# Patient Record
Sex: Female | Born: 1975
Health system: Southern US, Community
[De-identification: ages and names within clinical notes are randomized; demographics above are authoritative.]

## PROBLEM LIST (undated history)

## (undated) DIAGNOSIS — E559 Vitamin D deficiency, unspecified: Secondary | ICD-10-CM

## (undated) DIAGNOSIS — R739 Hyperglycemia, unspecified: Secondary | ICD-10-CM

## (undated) DIAGNOSIS — E785 Hyperlipidemia, unspecified: Secondary | ICD-10-CM

## (undated) DIAGNOSIS — D259 Leiomyoma of uterus, unspecified: Secondary | ICD-10-CM

## (undated) DIAGNOSIS — R87629 Unspecified abnormal cytological findings in specimens from vagina: Secondary | ICD-10-CM

## (undated) DIAGNOSIS — D649 Anemia, unspecified: Secondary | ICD-10-CM

## (undated) HISTORY — DX: Unspecified abnormal cytological findings in specimens from vagina: R87.629

## (undated) HISTORY — DX: Hyperglycemia, unspecified: R73.9

## (undated) HISTORY — DX: Hyperlipidemia, unspecified: E78.5

## (undated) HISTORY — DX: Anemia, unspecified: D64.9

## (undated) HISTORY — DX: Leiomyoma of uterus, unspecified: D25.9

## (undated) HISTORY — PX: NO PAST SURGERIES: SHX2092

## (undated) HISTORY — DX: Vitamin D deficiency, unspecified: E55.9

---

## 2017-02-27 DIAGNOSIS — E559 Vitamin D deficiency, unspecified: Secondary | ICD-10-CM | POA: Insufficient documentation

## 2017-02-27 DIAGNOSIS — R102 Pelvic and perineal pain: Secondary | ICD-10-CM | POA: Insufficient documentation

## 2018-03-25 DIAGNOSIS — Z113 Encounter for screening for infections with a predominantly sexual mode of transmission: Secondary | ICD-10-CM | POA: Diagnosis not present

## 2018-03-25 DIAGNOSIS — Z23 Encounter for immunization: Secondary | ICD-10-CM | POA: Diagnosis not present

## 2018-03-25 DIAGNOSIS — R102 Pelvic and perineal pain: Secondary | ICD-10-CM | POA: Diagnosis not present

## 2018-03-25 DIAGNOSIS — Z0000 Encounter for general adult medical examination without abnormal findings: Secondary | ICD-10-CM | POA: Diagnosis not present

## 2018-03-26 ENCOUNTER — Other Ambulatory Visit (HOSPITAL_BASED_OUTPATIENT_CLINIC_OR_DEPARTMENT_OTHER): Payer: Self-pay | Admitting: Family Medicine

## 2018-03-26 DIAGNOSIS — R102 Pelvic and perineal pain: Secondary | ICD-10-CM

## 2018-03-26 DIAGNOSIS — Z0000 Encounter for general adult medical examination without abnormal findings: Secondary | ICD-10-CM

## 2018-04-26 ENCOUNTER — Ambulatory Visit (HOSPITAL_BASED_OUTPATIENT_CLINIC_OR_DEPARTMENT_OTHER): Payer: Self-pay

## 2018-04-26 ENCOUNTER — Other Ambulatory Visit (HOSPITAL_BASED_OUTPATIENT_CLINIC_OR_DEPARTMENT_OTHER): Payer: Self-pay

## 2018-05-03 ENCOUNTER — Ambulatory Visit (HOSPITAL_BASED_OUTPATIENT_CLINIC_OR_DEPARTMENT_OTHER)
Admission: RE | Admit: 2018-05-03 | Discharge: 2018-05-03 | Disposition: A | Discharge status: Home / Self Care | Payer: 59 | Source: Ambulatory Visit | Attending: Family Medicine | Admitting: Family Medicine

## 2018-05-03 ENCOUNTER — Encounter (HOSPITAL_BASED_OUTPATIENT_CLINIC_OR_DEPARTMENT_OTHER): Payer: Self-pay | Admitting: Radiology

## 2018-05-03 DIAGNOSIS — Z1231 Encounter for screening mammogram for malignant neoplasm of breast: Secondary | ICD-10-CM | POA: Diagnosis not present

## 2018-05-03 DIAGNOSIS — Z0000 Encounter for general adult medical examination without abnormal findings: Secondary | ICD-10-CM

## 2018-05-03 DIAGNOSIS — R102 Pelvic and perineal pain: Secondary | ICD-10-CM | POA: Insufficient documentation

## 2018-05-18 DIAGNOSIS — N898 Other specified noninflammatory disorders of vagina: Secondary | ICD-10-CM | POA: Diagnosis not present

## 2018-05-18 DIAGNOSIS — S76012A Strain of muscle, fascia and tendon of left hip, initial encounter: Secondary | ICD-10-CM | POA: Diagnosis not present

## 2018-05-18 DIAGNOSIS — B373 Candidiasis of vulva and vagina: Secondary | ICD-10-CM | POA: Diagnosis not present

## 2018-10-01 IMAGING — US US PELVIS COMPLETE TRANSABD/TRANSVAG
1 series · 14 of 25 positions shown · non-contrast
Comparison: None

CLINICAL DATA: Generalized pelvic pain.

EXAM:
TRANSABDOMINAL AND TRANSVAGINAL ULTRASOUND OF PELVIS
TECHNIQUE: Both transabdominal and transvaginal ultrasound examinations of the
pelvis were performed. Transabdominal technique was performed for
global imaging of the pelvis including uterus, ovaries, adnexal
regions, and pelvic cul-de-sac. It was necessary to proceed with
endovaginal exam following the transabdominal exam to visualize the
endometrium and adnexal structures.

[Series 1: us pelvis complete transabd/transvag · 0.24mm/px · 14 of 45 slices shown]
[im 1/45]
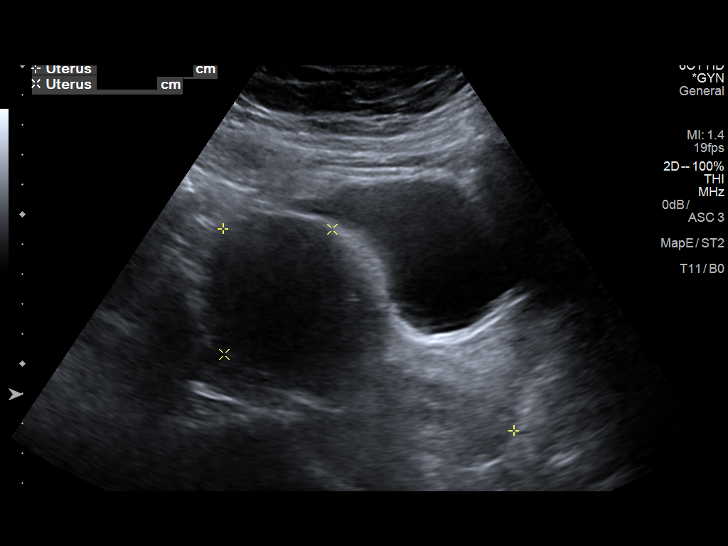
[im 4/45]
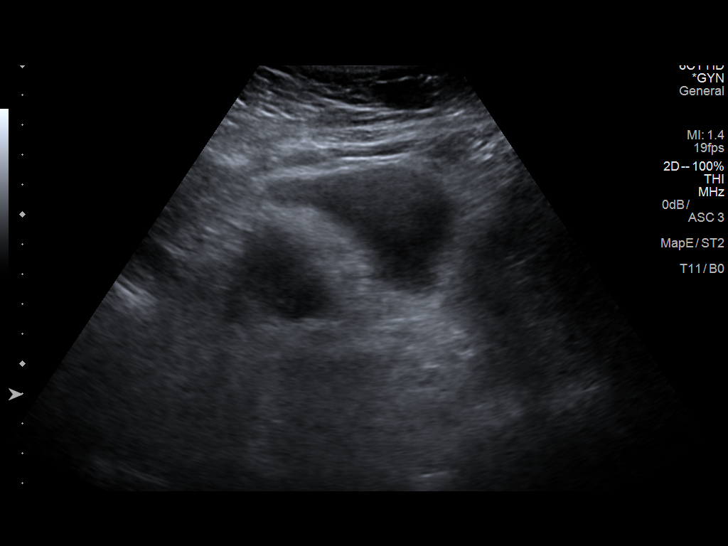
[im 8/45]
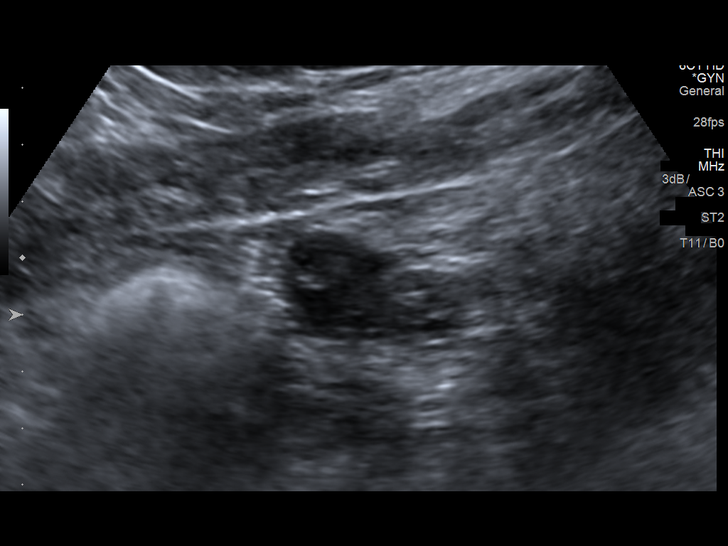
[im 12/45]
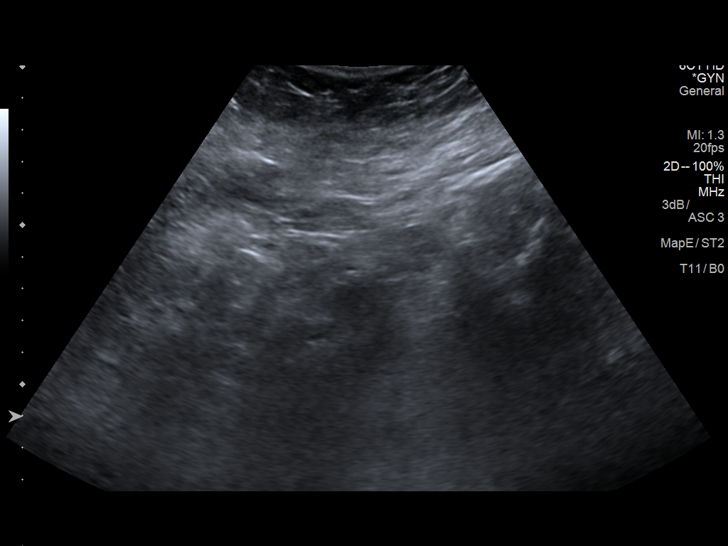
[im 15/45]
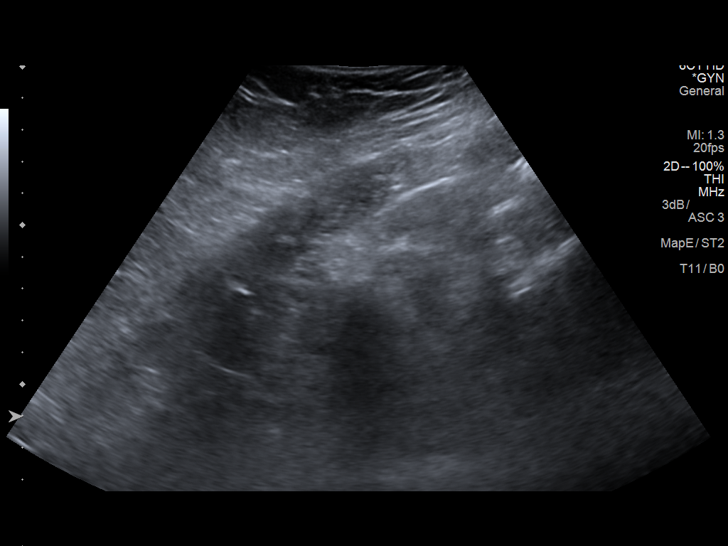
[im 17/45]
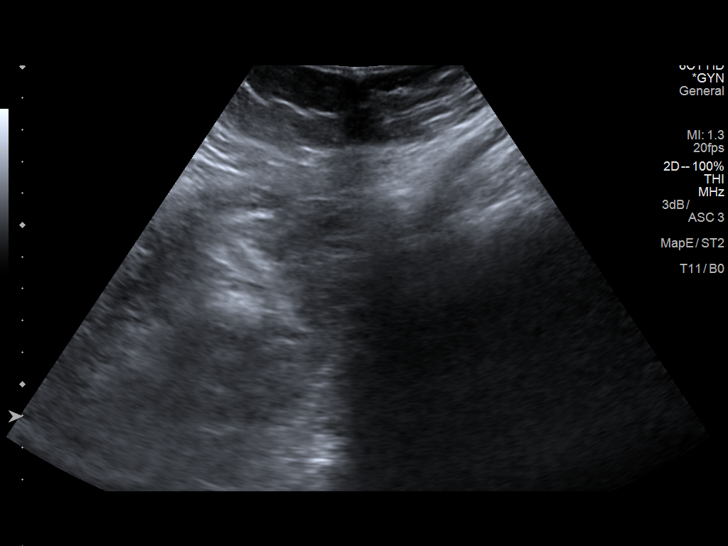
[im 21/45]
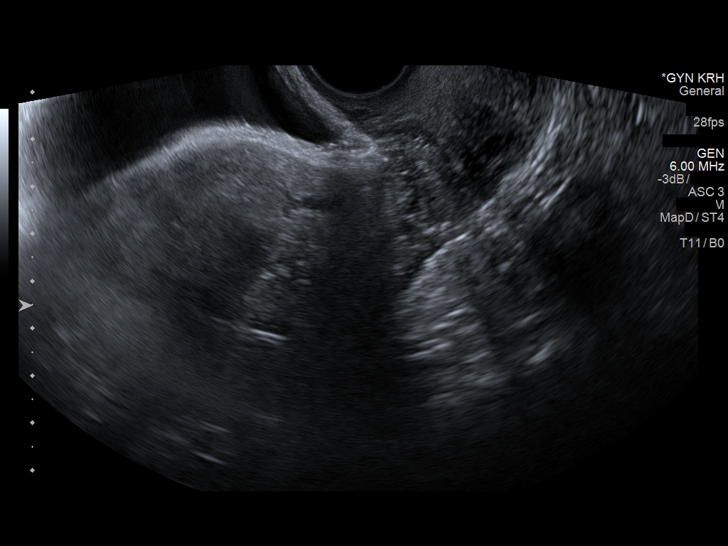
[im 24/45]
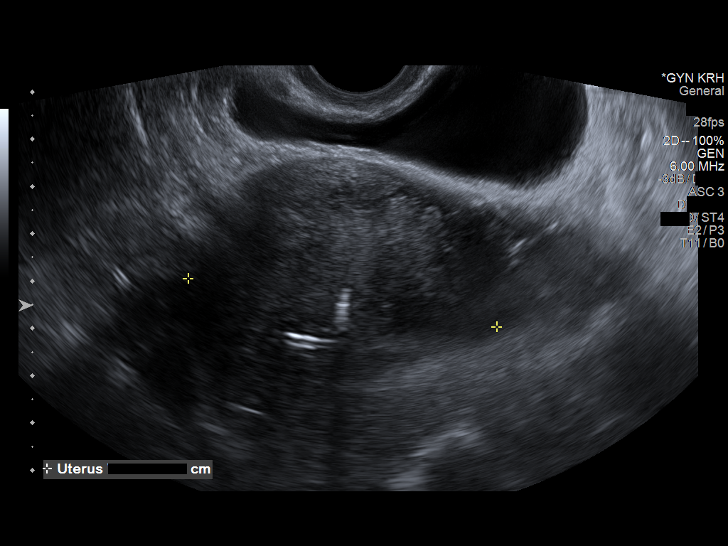
[im 28/45]
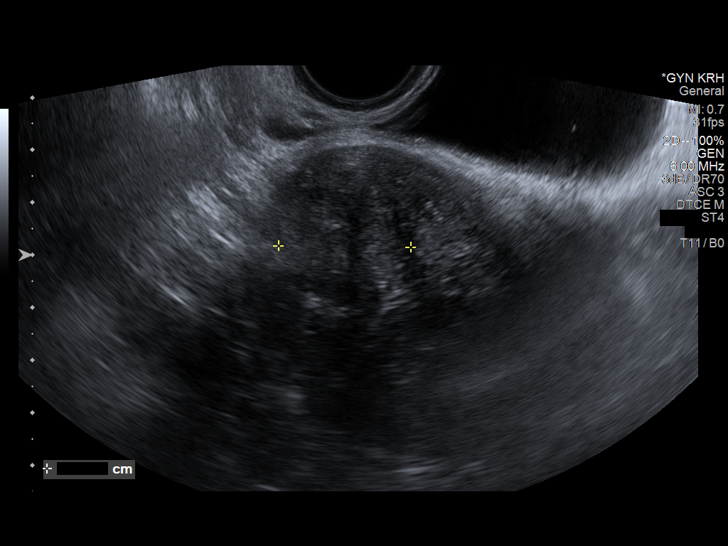
[im 30/45]
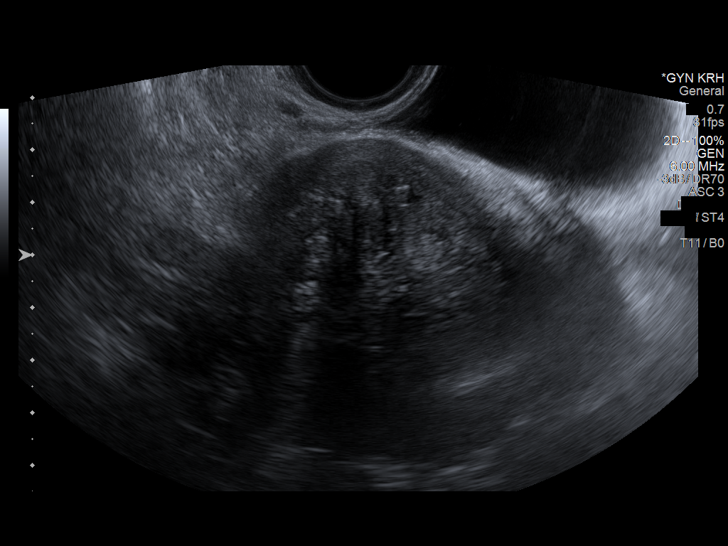
[im 34/45]
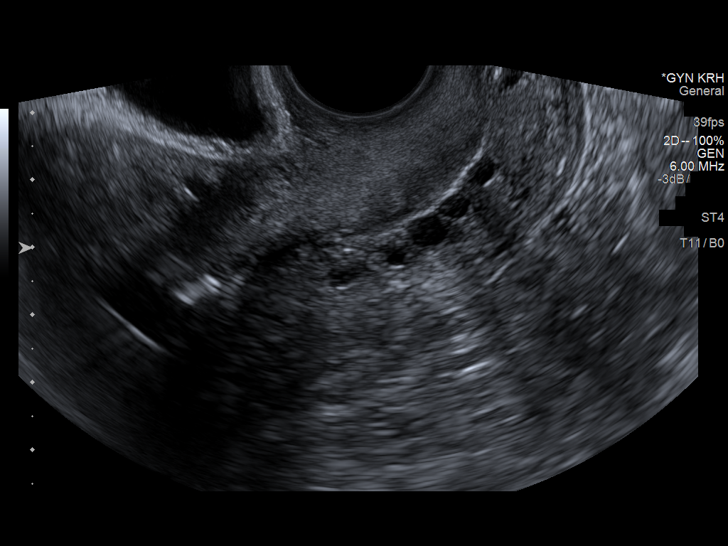
[im 37/45]
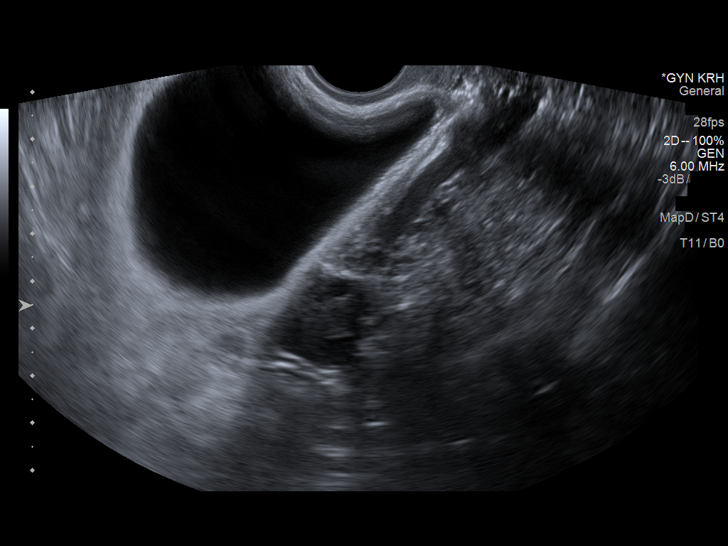
[im 41/45]
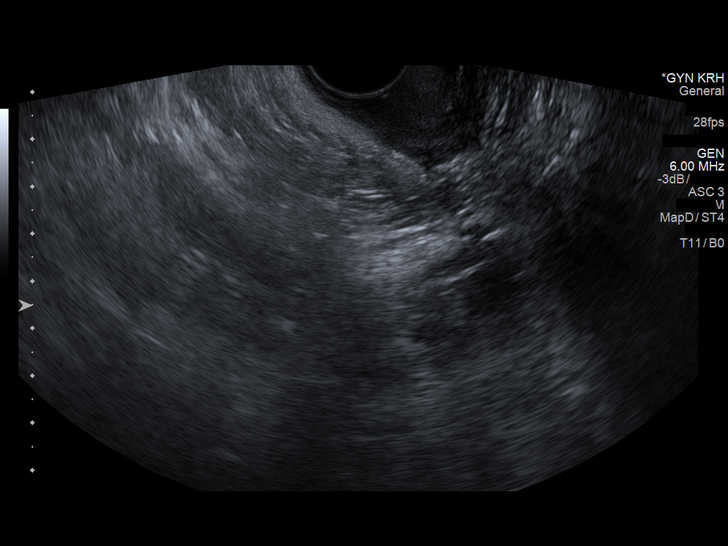
[im 45/45]
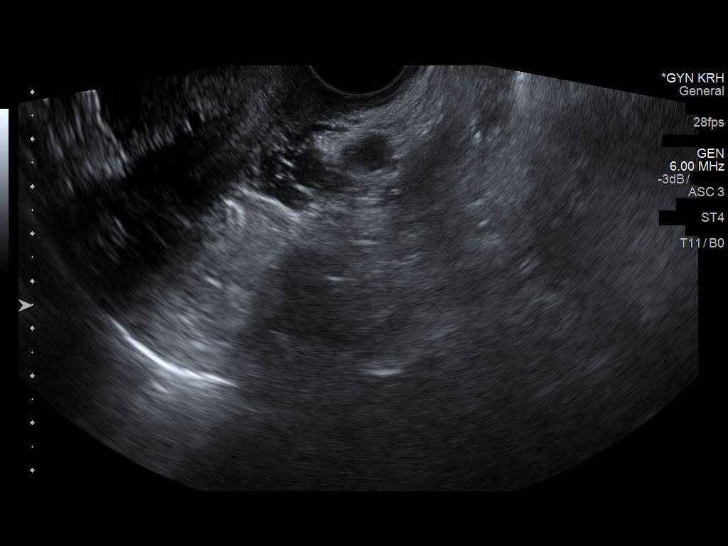

[14 of 25 positions shown; findings below may reference images not displayed]

FINDINGS: Uterus

Measurements: 10.9 x 6.0 x 6.6 cm. Within the right aspect of the
uterine body there is a 2.9 x 2.6 x 2.5 cm intramural fibroid.

Endometrium

Thickness: 6 mm. Intrauterine device appears appropriately
positioned.

Right ovary

Not visualized

Left ovary

Measurements: 2.4 x 1.9 x 2.1 cm. Normal appearance/no adnexal mass.

Other findings

No abnormal free fluid.
IMPRESSION: IUD appears appropriately positioned.

## 2019-07-09 ENCOUNTER — Inpatient Hospital Stay (HOSPITAL_BASED_OUTPATIENT_CLINIC_OR_DEPARTMENT_OTHER): Admission: RE | Admit: 2019-07-09 | Payer: 59 | Source: Ambulatory Visit

## 2019-07-09 ENCOUNTER — Other Ambulatory Visit: Payer: Self-pay

## 2019-07-09 ENCOUNTER — Ambulatory Visit (INDEPENDENT_AMBULATORY_CARE_PROVIDER_SITE_OTHER): Payer: 59 | Admitting: Obstetrics & Gynecology

## 2019-07-09 ENCOUNTER — Encounter: Payer: Self-pay | Admitting: Obstetrics & Gynecology

## 2019-07-09 VITALS — BP 110/63 | HR 59 | Ht 64.0 in | Wt 245.0 lb

## 2019-07-09 DIAGNOSIS — Z01419 Encounter for gynecological examination (general) (routine) without abnormal findings: Secondary | ICD-10-CM

## 2019-07-09 DIAGNOSIS — Z30432 Encounter for removal of intrauterine contraceptive device: Secondary | ICD-10-CM | POA: Diagnosis not present

## 2019-07-09 DIAGNOSIS — D219 Benign neoplasm of connective and other soft tissue, unspecified: Secondary | ICD-10-CM

## 2019-07-09 DIAGNOSIS — Z30430 Encounter for insertion of intrauterine contraceptive device: Secondary | ICD-10-CM

## 2019-07-09 MED ORDER — PARAGARD INTRAUTERINE COPPER IU IUD
INTRAUTERINE_SYSTEM | Freq: Once | INTRAUTERINE | Status: AC
  Administered 2019-07-09: 10:00:00 1 via INTRAUTERINE

## 2019-07-09 NOTE — Addendum Note (Signed)
Addended by: Phill Myron on: 07/09/2019 10:58 AM   Modules accepted: Orders

## 2019-07-09 NOTE — Progress Notes (Signed)
Subjective:     Emma Espinoza is a 43 y.o. female here for a routine exam.G2P2 Pt has Paragard IUD. She is interested in getting her current IUD removed and having it replaced. She reports a h/o fibrods that are small. She has monthly cycles that are heavy on days 1-2. She uses 3-4 pads on those days. She does not consider this a problem.   Gynecologic History No LMP recorded. Contraception: IUD Last Pap: 03/2018. Results were: normal Last mammogram: 05/03/2018. Results were: normal  Obstetric History OB History  Gravida Para Term Preterm AB Living  2 2 2     2   SAB TAB Ectopic Multiple Live Births          2    # Outcome Date GA Lbr Len/2nd Weight Sex Delivery Anes PTL Lv  2 Term      Vag-Spont     1 Term      Vag-Spont       The following portions of the patient's history were reviewed and updated as appropriate: allergies, current medications, past family history, past medical history, past social history, past surgical history and problem list.  Review of Systems Pertinent items are noted in HPI.    Objective:  BP 110/63   Pulse (!) 59   Ht 5\' 4"  (1.626 m)   Wt 245 lb (111.1 kg)   BMI 42.05 kg/m      General Appearance:    Alert, cooperative, no distress, appears stated age  Head:    Normocephalic, without obvious abnormality, atraumatic  Eyes:    conjunctiva/corneas clear, EOM's intact, both eyes  Ears:    Normal external ear canals, both ears  Nose:   Nares normal, septum midline, mucosa normal, no drainage    or sinus tenderness  Throat:   Lips, mucosa, and tongue normal; teeth and gums normal  Neck:   Supple, symmetrical, trachea midline, no adenopathy;    thyroid:  no enlargement/tenderness/nodules  Back:     Symmetric, no curvature, ROM normal, no CVA tenderness  Lungs:     respirations unlabored  Chest Wall:    No tenderness or deformity   Heart:    Regular rate and rhythm  Breast Exam:    No tenderness, masses, or nipple abnormality  Abdomen:     Soft,  non-tender, bowel sounds active all four quadrants,    no masses, no organomegaly  Genitalia:    Normal female without lesion, discharge or tenderness; uterus- small; mobile.       Extremities:   Extremities normal, atraumatic, no cyanosis or edema  Pulses:   2+ and symmetric all extremities  Skin:   Skin color, texture, turgor normal, no rashes or lesions   GYNECOLOGY CLINIC PROCEDURE NOTE Patient identified, informed consent performed.  Discussed risks of irregular bleeding, cramping, infection, malpositioning or misplacement of the IUD outside the uterus which may require further procedures. Time out was performed.  Urine pregnancy test negative.  Patient was in the dorsal lithotomy position, normal external genitalia was noted.  A speculum was placed in the patient's vagina, normal discharge was noted, no lesions. The multiparous cervix was visualized, no lesions, no abnormal discharge;  and the cervix was swabbed with Betadine using scopettes. The strings of the IUD were grasped and pulled using ring forceps.  The IUD was successfully removed in its entirety. Cervix visualized.  Cleaned with Betadine x 2.  Grasped anteriorly with a single tooth tenaculum.  Uterus sounded to 8cm.  Paragard  IUD placed per manufacturer's recommendations.  Strings trimmed to 3 cm. Tenaculum was removed, good hemostasis noted.  Patient tolerated procedure well.     Assessment:    Healthy female exam.   Contraception counseling- IUD removal and insertion. Reviewed option of LnIUD. Pt is happy with her Paragard.  Fibroids- asymptomatic.       Plan:   Patient was given post-procedure instructions.  Patient was asked to follow up in 4 weeks for IUD check. F/u in 1 year for annual  Mammogram ordered  Shonta Phillis L. Harraway-Smith, M.D., Cherlynn June

## 2019-07-09 NOTE — Patient Instructions (Signed)
Intrauterine Device Insertion, Care After  This sheet gives you information about how to care for yourself after your procedure. Your health care provider may also give you more specific instructions. If you have problems or questions, contact your health care provider. What can I expect after the procedure? After the procedure, it is common to have:  Cramps and pain in the abdomen.  Light bleeding (spotting) or heavier bleeding that is like your menstrual period. This may last for up to a few days.  Lower back pain.  Dizziness.  Headaches.  Nausea. Follow these instructions at home:  Before resuming sexual activity, check to make sure that you can feel the IUD string(s). You should be able to feel the end of the string(s) below the opening of your cervix. If your IUD string is in place, you may resume sexual activity. ? If you had a hormonal IUD inserted more than 7 days after your most recent period started, you will need to use a backup method of birth control for 7 days after IUD insertion. Ask your health care provider whether this applies to you.  Continue to check that the IUD is still in place by feeling for the string(s) after every menstrual period, or once a month.  Take over-the-counter and prescription medicines only as told by your health care provider.  Do not drive or use heavy machinery while taking prescription pain medicine.  Keep all follow-up visits as told by your health care provider. This is important. Contact a health care provider if:  You have bleeding that is heavier or lasts longer than a normal menstrual cycle.  You have a fever.  You have cramps or abdominal pain that get worse or do not get better with medicine.  You develop abdominal pain that is new or is not in the same area of earlier cramping and pain.  You feel lightheaded or weak.  You have abnormal or bad-smelling discharge from your vagina.  You have pain during sexual activity.   You have any of the following problems with your IUD string(s): ? The string bothers or hurts you or your sexual partner. ? You cannot feel the string. ? The string has gotten longer.  You can feel the IUD in your vagina.  You think you may be pregnant, or you miss your menstrual period.  You think you may have an STI (sexually transmitted infection). Get help right away if:  You have flu-like symptoms.  You have a fever and chills.  You can feel that your IUD has slipped out of place. Summary  After the procedure, it is common to have cramps and pain in the abdomen. It is also common to have light bleeding (spotting) or heavier bleeding that is like your menstrual period.  Continue to check that the IUD is still in place by feeling for the string(s) after every menstrual period, or once a month.  Keep all follow-up visits as told by your health care provider. This is important.  Contact your health care provider if you have problems with your IUD string(s), such as the string getting longer or bothering you or your sexual partner. This information is not intended to replace advice given to you by your health care provider. Make sure you discuss any questions you have with your health care provider. Document Released: 04/04/2011 Document Revised: 07/19/2017 Document Reviewed: 06/27/2016 Elsevier Patient Education  2020 [employer]  

## 2019-07-09 NOTE — Progress Notes (Signed)
Patient reports having paraguard for ten years. Patient needs this replaced and would like another paraguard. Kathrene Alu RN

## 2019-07-21 ENCOUNTER — Other Ambulatory Visit: Payer: Self-pay

## 2019-07-21 ENCOUNTER — Encounter (HOSPITAL_BASED_OUTPATIENT_CLINIC_OR_DEPARTMENT_OTHER): Payer: Self-pay

## 2019-07-21 ENCOUNTER — Ambulatory Visit (HOSPITAL_BASED_OUTPATIENT_CLINIC_OR_DEPARTMENT_OTHER)
Admission: RE | Admit: 2019-07-21 | Discharge: 2019-07-21 | Disposition: A | Discharge status: Home / Self Care | Payer: 59 | Source: Ambulatory Visit | Attending: Obstetrics & Gynecology | Admitting: Obstetrics & Gynecology

## 2019-07-21 DIAGNOSIS — Z1231 Encounter for screening mammogram for malignant neoplasm of breast: Secondary | ICD-10-CM | POA: Insufficient documentation

## 2019-07-21 DIAGNOSIS — Z01419 Encounter for gynecological examination (general) (routine) without abnormal findings: Secondary | ICD-10-CM

## 2019-08-12 ENCOUNTER — Ambulatory Visit: Payer: 59 | Admitting: Obstetrics & Gynecology

## 2020-07-13 ENCOUNTER — Ambulatory Visit (INDEPENDENT_AMBULATORY_CARE_PROVIDER_SITE_OTHER): Payer: 59 | Admitting: Family Medicine

## 2020-07-13 ENCOUNTER — Encounter: Payer: Self-pay | Admitting: Family Medicine

## 2020-07-13 ENCOUNTER — Other Ambulatory Visit (HOSPITAL_COMMUNITY)
Admission: RE | Admit: 2020-07-13 | Discharge: 2020-07-13 | Disposition: A | Discharge status: Home / Self Care | Payer: 59 | Source: Ambulatory Visit | Attending: Family Medicine | Admitting: Family Medicine

## 2020-07-13 ENCOUNTER — Other Ambulatory Visit: Payer: Self-pay

## 2020-07-13 DIAGNOSIS — Z01419 Encounter for gynecological examination (general) (routine) without abnormal findings: Secondary | ICD-10-CM | POA: Diagnosis not present

## 2020-07-13 DIAGNOSIS — Z124 Encounter for screening for malignant neoplasm of cervix: Secondary | ICD-10-CM | POA: Insufficient documentation

## 2020-07-13 NOTE — Progress Notes (Signed)
  Subjective:     Emma Espinoza is a 44 y.o. female and is here for a comprehensive physical exam. The patient reports no problems. Sometimes has a sticking pain in her cervix at times, once every 3 months. Goes away on its own, and does not need medication. She is curious if this is related to her Copper IUD. She had this placed last year. No issues and cycles are regular and somewhat heavy. Has h/o fibroid. Last u/s revealed it to be 2.9 cm.  The following portions of the patient's history were reviewed and updated as appropriate: allergies, current medications, past family history, past medical history, past social history, past surgical history and problem list.  Review of Systems Pertinent items noted in HPI and remainder of comprehensive ROS otherwise negative.   Objective:    BP 105/73   Pulse 63   Ht 5\' 4"  (1.626 m)   Wt 252 lb (114.3 kg)   LMP 07/03/2020   BMI 43.26 kg/m  General appearance: alert, cooperative, appears stated age and moderately obese Head: Normocephalic, without obvious abnormality, atraumatic Neck: no adenopathy, supple, symmetrical, trachea midline and thyroid not enlarged, symmetric, no tenderness/mass/nodules Lungs: clear to auscultation bilaterally Breasts: normal appearance, no masses or tenderness Heart: regular rate and rhythm, S1, S2 normal, no murmur, click, rub or gallop Abdomen: soft, non-tender; bowel sounds normal; no masses,  no organomegaly Pelvic: cervix normal in appearance, external genitalia normal, no adnexal masses or tenderness, no cervical motion tenderness, uterus normal size, shape, and consistency and vagina normal without discharge Extremities: Homans sign is negative, no sign of DVT Pulses: 2+ and symmetric Skin: Skin color, texture, turgor normal. No rashes or lesions Lymph nodes: Cervical, supraclavicular, and axillary nodes normal. Neurologic: Grossly normal    Assessment:    Healthy female exam.      Plan:   Screening for malignant neoplasm of cervix - Plan: Cytology - PAP( [facility])  Encounter for gynecological examination without abnormal finding - annual labs--updated HM - Plan: MM DIGITAL SCREENING BILATERAL, CBC, Comprehensive metabolic panel, Hemoglobin A1c, TSH, Lipid panel, VITAMIN D 25 Hydroxy (Vit-D Deficiency, Fractures)  Return in 1 year (on 07/13/2021).    See After Visit Summary for Counseling Recommendations

## 2020-07-13 NOTE — Patient Instructions (Signed)
 Preventive Care 21-42 Years Old, Female Preventive care refers to visits with your health care provider and lifestyle choices that can promote health and wellness. This includes:  A yearly physical exam. This may also be called an annual well check.  Regular dental visits and eye exams.  Immunizations.  Screening for certain conditions.  Healthy lifestyle choices, such as eating a healthy diet, getting regular exercise, not using drugs or products that contain nicotine and tobacco, and limiting alcohol use. What can I expect for my preventive care visit? Physical exam Your health care provider will check your:  Height and weight. This may be used to calculate body mass index (BMI), which tells if you are at a healthy weight.  Heart rate and blood pressure.  Skin for abnormal spots. Counseling Your health care provider may ask you questions about your:  Alcohol, tobacco, and drug use.  Emotional well-being.  Home and relationship well-being.  Sexual activity.  Eating habits.  Work and work environment.  Method of birth control.  Menstrual cycle.  Pregnancy history. What immunizations do I need?  Influenza (flu) vaccine  This is recommended every year. Tetanus, diphtheria, and pertussis (Tdap) vaccine  You may need a Td booster every 10 years. Varicella (chickenpox) vaccine  You may need this if you have not been vaccinated. Human papillomavirus (HPV) vaccine  If recommended by your health care provider, you may need three doses over 6 months. Measles, mumps, and rubella (MMR) vaccine  You may need at least one dose of MMR. You may also need a second dose. Meningococcal conjugate (MenACWY) vaccine  One dose is recommended if you are age 19-21 years and a first-year college student living in a residence hall, or if you have one of several medical conditions. You may also need additional booster doses. Pneumococcal conjugate (PCV13) vaccine  You may need  this if you have certain conditions and were not previously vaccinated. Pneumococcal polysaccharide (PPSV23) vaccine  You may need one or two doses if you smoke cigarettes or if you have certain conditions. Hepatitis A vaccine  You may need this if you have certain conditions or if you travel or work in places where you may be exposed to hepatitis A. Hepatitis B vaccine  You may need this if you have certain conditions or if you travel or work in places where you may be exposed to hepatitis B. Haemophilus influenzae type b (Hib) vaccine  You may need this if you have certain conditions. You may receive vaccines as individual doses or as more than one vaccine together in one shot (combination vaccines). Talk with your health care provider about the risks and benefits of combination vaccines. What tests do I need?  Blood tests  Lipid and cholesterol levels. These may be checked every 5 years starting at age 20.  Hepatitis C test.  Hepatitis B test. Screening  Diabetes screening. This is done by checking your blood sugar (glucose) after you have not eaten for a while (fasting).  Sexually transmitted disease (STD) testing.  BRCA-related cancer screening. This may be done if you have a family history of breast, ovarian, tubal, or peritoneal cancers.  Pelvic exam and Pap test. This may be done every 3 years starting at age 21. Starting at age 30, this may be done every 5 years if you have a Pap test in combination with an HPV test. Talk with your health care provider about your test results, treatment options, and if necessary, the need for more   tests. Follow these instructions at home: Eating and drinking   Eat a diet that includes fresh fruits and vegetables, whole grains, lean protein, and low-fat dairy.  Take vitamin and mineral supplements as recommended by your health care provider.  Do not drink alcohol if: ? Your health care provider tells you not to drink. ? You are  pregnant, may be pregnant, or are planning to become pregnant.  If you drink alcohol: ? Limit how much you have to 0-1 drink a day. ? Be aware of how much alcohol is in your drink. In the [place] one drink equals one 12 oz bottle of beer (355 mL), one 5 oz glass of wine (148 mL), or one 1 oz glass of hard liquor (44 mL). Lifestyle  Take daily care of your teeth and gums.  Stay active. Exercise for at least 30 minutes on 5 or more days each week.  Do not use any products that contain nicotine or tobacco, such as cigarettes, e-cigarettes, and chewing tobacco. If you need help quitting, ask your health care provider.  If you are sexually active, practice safe sex. Use a condom or other form of birth control (contraception) in order to prevent pregnancy and STIs (sexually transmitted infections). If you plan to become pregnant, see your health care provider for a preconception visit. What's next?  Visit your health care provider once a year for a well check visit.  Ask your health care provider how often you should have your eyes and teeth checked.  Stay up to date on all vaccines. This information is not intended to replace advice given to you by your health care provider. Make sure you discuss any questions you have with your health care provider. Document Revised: 04/17/2018 Document Reviewed: 04/17/2018 Elsevier Patient Education  2020 [employer].

## 2020-07-14 LAB — CBC
Hematocrit: 38.1 % (ref 34.0–46.6)
Hemoglobin: 11.6 g/dL (ref 11.1–15.9)
MCH: 24.1 pg — ABNORMAL LOW (ref 26.6–33.0)
MCHC: 30.4 g/dL — ABNORMAL LOW (ref 31.5–35.7)
MCV: 79 fL (ref 79–97)
Platelets: 444 10*3/uL (ref 150–450)
RBC: 4.81 x10E6/uL (ref 3.77–5.28)
RDW: 14.3 % (ref 11.7–15.4)
WBC: 5.1 10*3/uL (ref 3.4–10.8)

## 2020-07-14 LAB — COMPREHENSIVE METABOLIC PANEL
ALT: 12 IU/L (ref 0–32)
AST: 27 IU/L (ref 0–40)
Albumin/Globulin Ratio: 1.2 (ref 1.2–2.2)
Albumin: 3.9 g/dL (ref 3.8–4.8)
Alkaline Phosphatase: 66 IU/L (ref 44–121)
BUN/Creatinine Ratio: 12 (ref 9–23)
BUN: 7 mg/dL (ref 6–24)
Bilirubin Total: 0.3 mg/dL (ref 0.0–1.2)
CO2: 22 mmol/L (ref 20–29)
Calcium: 9.1 mg/dL (ref 8.7–10.2)
Chloride: 103 mmol/L (ref 96–106)
Creatinine, Ser: 0.6 mg/dL (ref 0.57–1.00)
GFR calc Af Amer: 130 mL/min/{1.73_m2} (ref >59)
GFR calc non Af Amer: 113 mL/min/{1.73_m2} (ref >59)
Globulin, Total: 3.3 g/dL (ref 1.5–4.5)
Glucose: 88 mg/dL (ref 65–99)
Sodium: 138 mmol/L (ref 134–144)
Total Protein: 7.2 g/dL (ref 6.0–8.5)

## 2020-07-14 LAB — LIPID PANEL
Chol/HDL Ratio: 4.3 ratio (ref 0.0–4.4)
Cholesterol, Total: 168 mg/dL (ref 100–199)
HDL: 39 mg/dL — ABNORMAL LOW (ref >39)
LDL Chol Calc (NIH): 112 mg/dL — ABNORMAL HIGH (ref 0–99)
Triglycerides: 89 mg/dL (ref 0–149)
VLDL Cholesterol Cal: 17 mg/dL (ref 5–40)

## 2020-07-14 LAB — HEMOGLOBIN A1C
Est. average glucose Bld gHb Est-mCnc: 137 mg/dL
Hgb A1c MFr Bld: 6.4 % — ABNORMAL HIGH (ref 4.8–5.6)

## 2020-07-14 LAB — TSH: TSH: 1.33 u[IU]/mL (ref 0.450–4.500)

## 2020-07-14 LAB — VITAMIN D 25 HYDROXY (VIT D DEFICIENCY, FRACTURES): Vit D, 25-Hydroxy: 22.1 ng/mL — ABNORMAL LOW (ref 30.0–100.0)

## 2020-07-18 ENCOUNTER — Telehealth: Payer: Self-pay

## 2020-07-18 NOTE — Telephone Encounter (Signed)
Left message for patient to return call to office. Jyasia Markoff  RN 

## 2020-07-18 NOTE — Telephone Encounter (Signed)
Patient returned call and made aware of vit D defiency and to take oscal-D two times daily. Also made aware of elevated HgbA1C and need for primary care doctor to follow this. She states she is in the middle of trying to find one. Kathrene Alu, RN

## 2020-07-18 NOTE — Telephone Encounter (Signed)
-----   Message from Donnamae Jude, MD sent at 07/15/2020  9:57 AM EST ----- Has vitamin D deficiency--start oscal-D bid. Also with pre-diabetes--may need to work on exercise, diet, weight loss

## 2020-07-19 LAB — CYTOLOGY - PAP
Adequacy: ABSENT
Comment: NEGATIVE
Diagnosis: NEGATIVE
High risk HPV: NEGATIVE

## 2020-07-29 ENCOUNTER — Ambulatory Visit
Admission: RE | Admit: 2020-07-29 | Discharge: 2020-07-29 | Disposition: A | Discharge status: Home / Self Care | Payer: 59 | Source: Ambulatory Visit | Attending: Family Medicine | Admitting: Family Medicine

## 2020-07-29 ENCOUNTER — Other Ambulatory Visit: Payer: Self-pay

## 2020-07-29 DIAGNOSIS — Z01419 Encounter for gynecological examination (general) (routine) without abnormal findings: Secondary | ICD-10-CM

## 2020-07-29 DIAGNOSIS — Z1231 Encounter for screening mammogram for malignant neoplasm of breast: Secondary | ICD-10-CM | POA: Diagnosis not present

## 2020-12-22 IMAGING — MG DIGITAL SCREENING BILAT W/ TOMO W/ CAD
6 of 12 series · 6 of 36 positions shown · non-contrast
Comparison: Previous exam(s).

CLINICAL DATA: Screening.

EXAM:
DIGITAL SCREENING BILATERAL MAMMOGRAM WITH TOMO AND CAD

[R MLO synth-2D (1 of 2)]
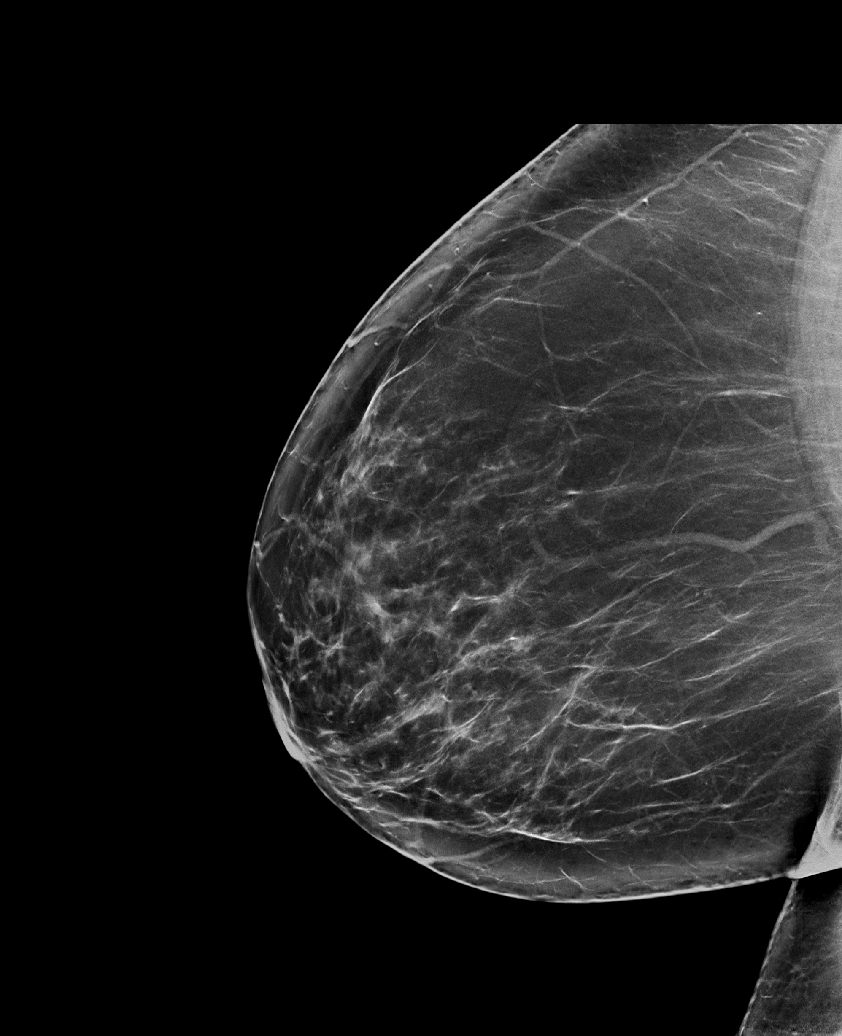

[R CC synth-2D]
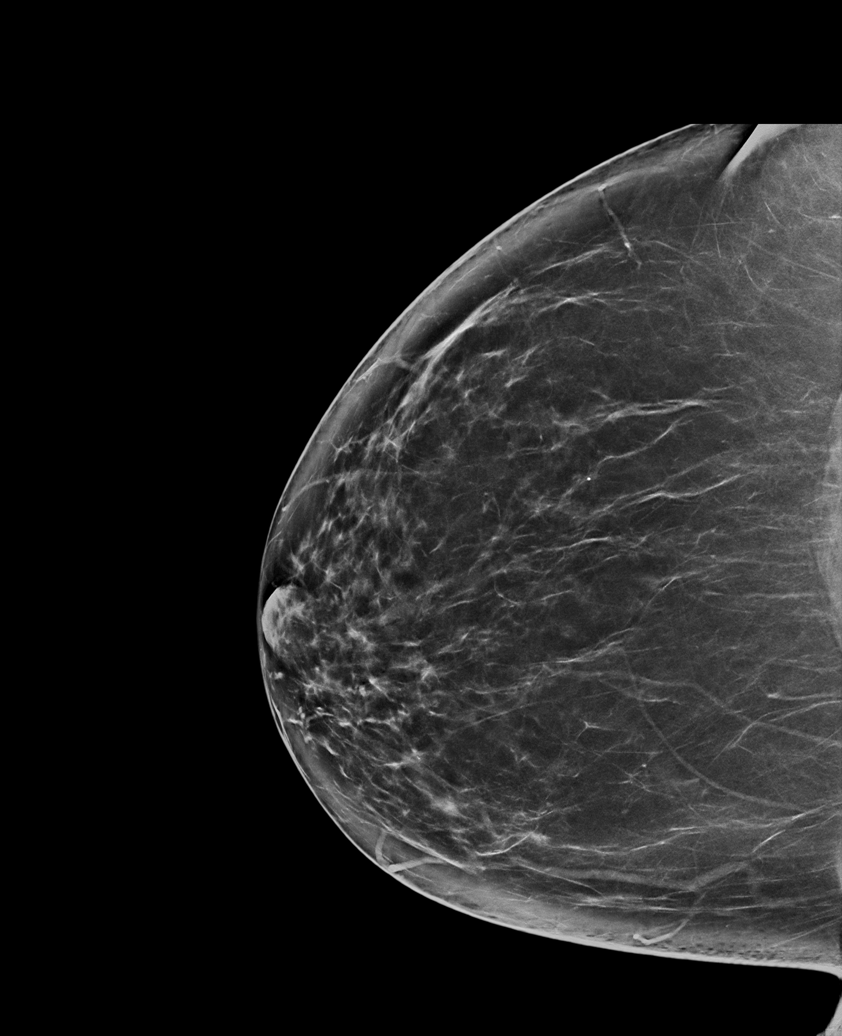

[L MLO synth-2D (1 of 2)]
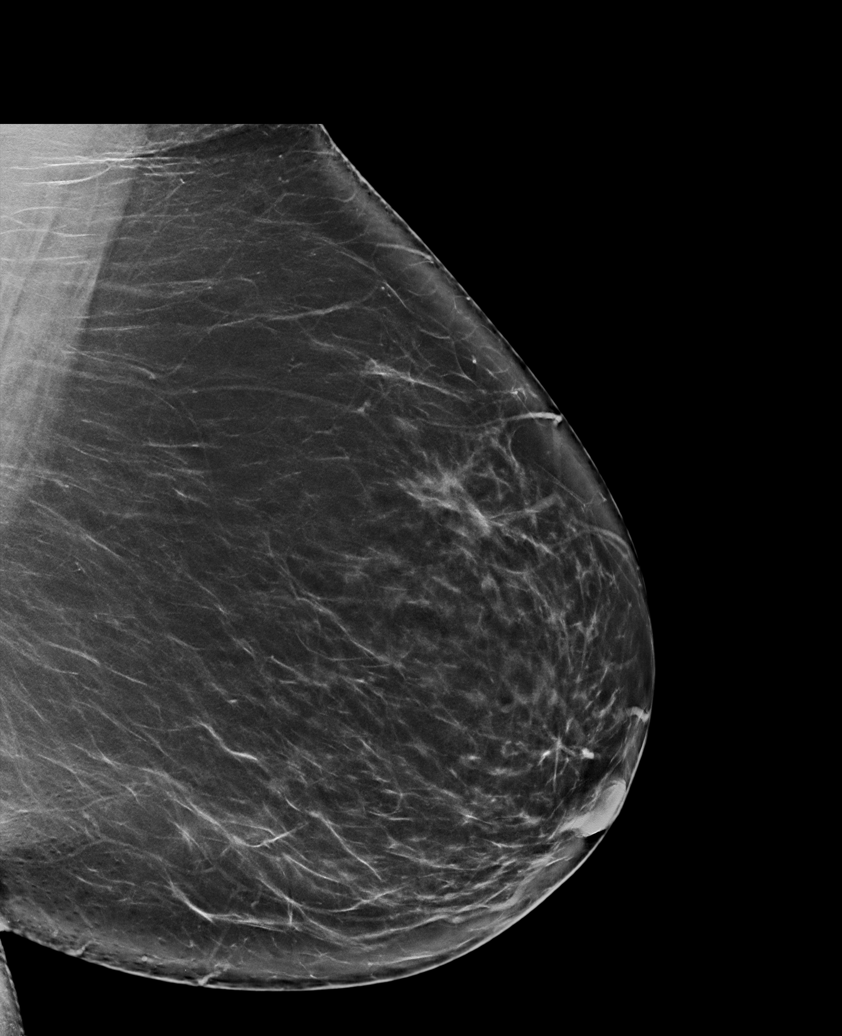

[L MLO synth-2D (2 of 2)]
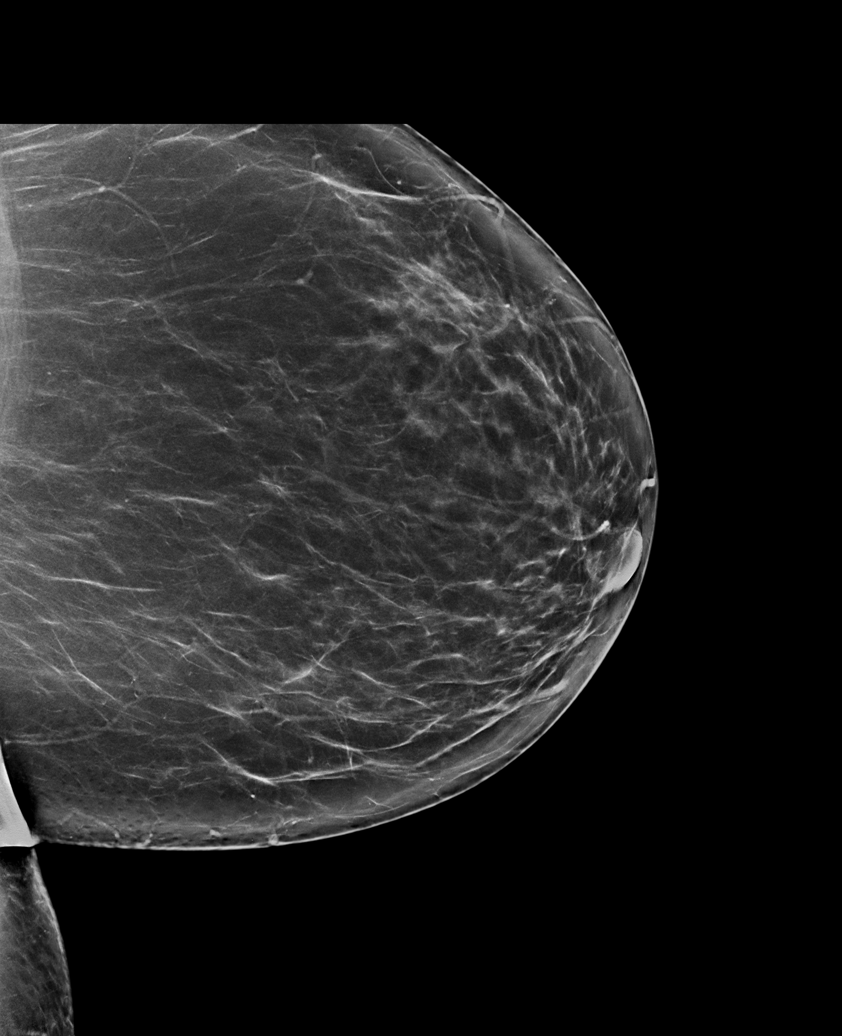

[L CC synth-2D]
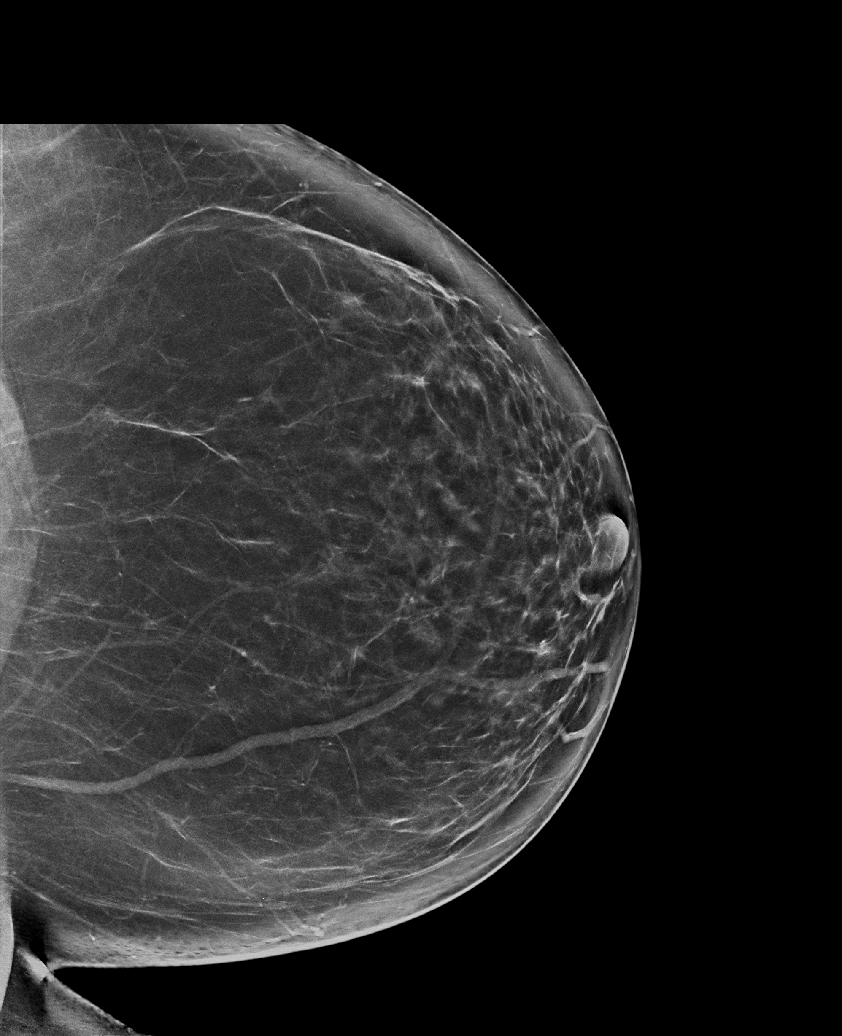

[R MLO synth-2D (2 of 2)]
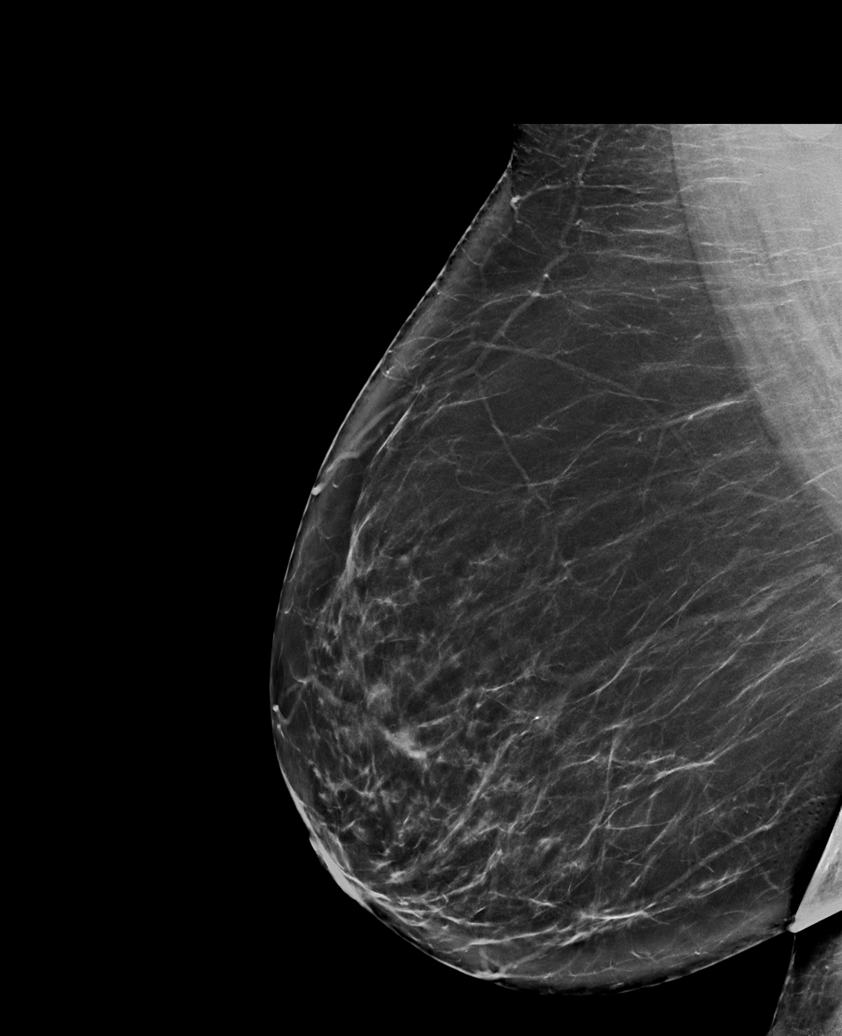

[6 of 36 positions shown; findings below may reference images not displayed]

ACR Breast Density Category b: There are scattered areas of
fibroglandular density.
FINDINGS: There are no findings suspicious for malignancy. Images were
processed with CAD.
IMPRESSION: No mammographic evidence of malignancy. A result letter of this
screening mammogram will be mailed directly to the patient.

RECOMMENDATION:
Screening mammogram in one year. (Code:[id number])

BI-RADS CATEGORY  1: Negative.

## 2021-01-12 DIAGNOSIS — Z0000 Encounter for general adult medical examination without abnormal findings: Secondary | ICD-10-CM | POA: Diagnosis not present

## 2021-01-12 DIAGNOSIS — Z13228 Encounter for screening for other metabolic disorders: Secondary | ICD-10-CM | POA: Diagnosis not present

## 2021-01-12 DIAGNOSIS — I498 Other specified cardiac arrhythmias: Secondary | ICD-10-CM | POA: Diagnosis not present

## 2021-01-12 DIAGNOSIS — Z1329 Encounter for screening for other suspected endocrine disorder: Secondary | ICD-10-CM | POA: Diagnosis not present

## 2021-01-12 DIAGNOSIS — Z8249 Family history of ischemic heart disease and other diseases of the circulatory system: Secondary | ICD-10-CM | POA: Diagnosis not present

## 2021-01-12 DIAGNOSIS — R079 Chest pain, unspecified: Secondary | ICD-10-CM | POA: Diagnosis not present

## 2021-01-12 DIAGNOSIS — Z111 Encounter for screening for respiratory tuberculosis: Secondary | ICD-10-CM | POA: Diagnosis not present

## 2021-01-12 DIAGNOSIS — D649 Anemia, unspecified: Secondary | ICD-10-CM | POA: Diagnosis not present

## 2021-01-12 DIAGNOSIS — R200 Anesthesia of skin: Secondary | ICD-10-CM | POA: Diagnosis not present

## 2021-01-12 DIAGNOSIS — R001 Bradycardia, unspecified: Secondary | ICD-10-CM | POA: Diagnosis not present

## 2021-01-12 DIAGNOSIS — Z13220 Encounter for screening for lipoid disorders: Secondary | ICD-10-CM | POA: Diagnosis not present

## 2021-07-03 ENCOUNTER — Other Ambulatory Visit: Payer: Self-pay

## 2021-07-03 ENCOUNTER — Ambulatory Visit
Admission: EM | Admit: 2021-07-03 | Discharge: 2021-07-03 | Disposition: A | Discharge status: Home / Self Care | Payer: 59

## 2021-07-03 DIAGNOSIS — M25512 Pain in left shoulder: Secondary | ICD-10-CM | POA: Diagnosis not present

## 2021-07-03 DIAGNOSIS — X503XXA Overexertion from repetitive movements, initial encounter: Secondary | ICD-10-CM

## 2021-07-03 DIAGNOSIS — M549 Dorsalgia, unspecified: Secondary | ICD-10-CM

## 2021-07-03 DIAGNOSIS — M62838 Other muscle spasm: Secondary | ICD-10-CM

## 2021-07-03 MED ORDER — METHYLPREDNISOLONE 4 MG PO TBPK: ORAL_TABLET | ORAL | 0 refills | Status: DC

## 2021-07-03 MED ORDER — BACLOFEN 10 MG PO TABS
10.0000 mg | ORAL_TABLET | Freq: Every day | ORAL | 0 refills | Status: AC
Start: 2021-07-03 — End: 2021-07-10

## 2021-07-03 MED ORDER — KETOROLAC TROMETHAMINE 60 MG/2ML IM SOLN
60.0000 mg | Freq: Once | INTRAMUSCULAR | Status: AC
  Administered 2021-07-03: 60 mg via INTRAMUSCULAR

## 2021-07-03 NOTE — ED Triage Notes (Signed)
Pt repots having left shoulder and breast pain since Saturday. Patient states at times pain is sometimes there is chest pain (sharp pain with inspiration). Patient denies SOB,  and vision changes. There is no active chest pain at this time.

## 2021-07-03 NOTE — ED Notes (Signed)
EKG results given to provider.  

## 2021-07-03 NOTE — Discharge Instructions (Addendum)
The EKG we performed in the office today was normal.  I have no concern that your pain is secondary to a cardiac issue.  You received an injection of ketorolac today in the clinic which did significantly reduce your pain for the next 6 to 8 hours.  Tomorrow morning, please begin taking Medrol, a tapering dose of steroids, please take 1 row of tablets daily.  Tonight, please take 1 tablet of baclofen before you go to bed, this is a muscle relaxer and should provide you with a nice, restorative sleep.  If you feel that another injection of ketorolac would be of benefit, please come back to urgent care and we will be happy to provide this for you.    I have provided you with a note to be out of work for the next 3 days.  I can also provide you with a second note if you decide that you need to come back for the second injection.  Conservative care at home includes deep tissue massage either with thumbs, elbows or a massage gun (picture included).  You may also wish to invest in having a professional deep tissue massage.  Applying heat and or ice to the affected area may also provide you some relief.  You can also begin taking high doses of Advil (800 mg 3 times daily) on day 3 of the Medrol Dosepak if you feel this is needed.  I do not recommend stretching as this may make the spasming worse.  Thank you for visiting urgent care today, I hope you feel better soon.

## 2021-07-03 NOTE — ED Provider Notes (Signed)
[facility]    CSN: [id number] Arrival date & time: 07/03/21  2536   History   Chief Complaint No chief complaint on file.  HPI Emma Espinoza is a 45 y.o. female. Patient complains of pain in her left upper shoulder that radiates around underneath her arm towards the chest.  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  Patient states that sometimes the pain is sharp, especially when she tried to take a deep breath but she denies shortness of breath and vision changes.  Patient also is denies frank chest pain, EKG today is normal.  Patient states she is had this in the past but has never had it this bad.  Patient is accompanied by her husband today.  Patient states that sometimes her husband spends a few minutes trying to "rub it out" but does not do it long enough.  Patient states she tried ibuprofen with a little bit of relief.  The history is provided by the patient.   History reviewed. No pertinent past medical history. Patient Active Problem List   Diagnosis Date Noted   Pelvic pain in female 02/27/2017   Vitamin D deficiency 02/27/2017   History reviewed. No pertinent surgical history. OB History     Gravida  2   Para  2   Term  2   Preterm      AB      Living  2      SAB      IAB      Ectopic      Multiple      Live Births  2          Home Medications    Prior to Admission medications   Medication Sig Start Date End Date Taking? Authorizing Provider  baclofen (LIORESAL) 10 MG tablet Take 1 tablet (10 mg total) by mouth at bedtime for 7 days. 07/03/21 07/10/21 Yes Lynden Oxford Scales, PA-C  ibuprofen (ADVIL) 200 MG tablet Take 200 mg by mouth every 6 (six) hours as needed.   Yes [provider]  methylPREDNISolone (MEDROL DOSEPAK) 4 MG TBPK tablet Take 24 mg on day 1, 20 mg on day 2, 16 mg on day 3, 12 mg on  day 4, 8 mg on day 5, 4 mg on day 6. 07/03/21  Yes Lynden Oxford Scales, PA-C   Family History Family History  Problem Relation Age of Onset   Hypertension Mother    Diabetes Mother    Diabetes Sister    Hypertension Sister    Cancer Neg Hx    Social History Social History   Tobacco Use   Smoking status: Never   Smokeless tobacco: Never  Vaping Use   Vaping Use: Never used  Substance Use Topics   Alcohol use: Never   Drug use: Never   Allergies   Patient has no known allergies.  Review of Systems Review of Systems Pertinent findings noted in history of present illness.   Physical Exam Triage Vital Signs ED Triage Vitals  Enc Vitals Group     BP 06/16/21 0827 (!) 147/82     Pulse Rate 06/16/21 0827 72     Resp 06/16/21 0827 18     Temp 06/16/21 0827 98.3 F (36.8 C)     Temp Source 06/16/21 0827 Oral     SpO2 06/16/21 0827 98 %  Weight --      Height --      Head Circumference --      Peak Flow --      Pain Score 06/16/21 0826 5     Pain Loc --      Pain Edu? --      Excl. in [place]? --    No data found.  Updated Vital Signs BP 133/86 (BP Location: Right Arm)   Pulse 78   Temp 98.1 F (36.7 C) (Oral)   Resp 20   SpO2 98%   Visual Acuity Right Eye Distance:   Left Eye Distance:   Bilateral Distance:    Right Eye Near:   Left Eye Near:    Bilateral Near:     Physical Exam Vitals and nursing note reviewed.  Constitutional:      General: She is not in acute distress.    Appearance: Normal appearance. She is not ill-appearing.  HENT:     Head: Normocephalic and atraumatic.  Eyes:     General: Lids are normal.        Right eye: No discharge.        Left eye: No discharge.     Extraocular Movements: Extraocular movements intact.     Conjunctiva/sclera: Conjunctivae normal.     Right eye: Right conjunctiva is not injected.     Left eye: Left conjunctiva is not injected.  Neck:     Trachea: Trachea and phonation normal.  Cardiovascular:      Rate and Rhythm: Normal rate and regular rhythm.     Pulses: Normal pulses.     Heart sounds: Normal heart sounds. No murmur heard.   No friction rub. No gallop.  Pulmonary:     Effort: Pulmonary effort is normal. No accessory muscle usage, prolonged expiration or respiratory distress.     Breath sounds: Normal breath sounds. No stridor, decreased air movement or transmitted upper airway sounds. No decreased breath sounds, wheezing, rhonchi or rales.  Chest:     Chest wall: No tenderness.  Musculoskeletal:        General: Tenderness (Left upper trapezius) present. Normal range of motion.     Cervical back: Normal range of motion and neck supple. Normal range of motion.  Lymphadenopathy:     Cervical: No cervical adenopathy.  Skin:    General: Skin is warm and dry.     Findings: No erythema or rash.  Neurological:     General: No focal deficit present.     Mental Status: She is alert and oriented to person, place, and time.  Psychiatric:        Mood and Affect: Mood normal.        Behavior: Behavior normal.   UC Treatments / Results  Labs (all labs ordered are listed, but only abnormal results are displayed)  Labs Reviewed - No data to display  EKG  Radiology No results found.  Procedures Procedures (including critical care time)  Medications Ordered in UC Medications  ketorolac (TORADOL) injection 60 mg (60 mg Intramuscular Given 07/03/21 1016)    Initial Impression / Assessment and Plan / UC Course  I have reviewed the triage vital signs and the nursing notes.  Pertinent labs & imaging results that were available during my care of the patient were reviewed by me and considered in my medical decision making (see chart for details).      Patient has diffuse muscle spasm of the left upper trapezius and paraspinous cervical muscles  cervical paraspinous muscles.  Patient was provided with an injection of ketorolac today and a prescription for Medrol Dosepak.  Patient was  advised she could come back in 2 days for repeat injection of ketorolac if she finds this beneficial.  Have also advised patient to take baclofen at bedtime.  Final Clinical Impressions(s) / UC Diagnoses   Final diagnoses:  Spasm of cervical paraspinous muscle  Pain in joint of left shoulder  Upper back pain on left side  Overuse injury     Discharge Instructions      The EKG we performed in the office today was normal.  I have no concern that your pain is secondary to a cardiac issue.  You received an injection of ketorolac today in the clinic which did significantly reduce your pain for the next 6 to 8 hours.  Tomorrow morning, please begin taking Medrol, a tapering dose of steroids, please take 1 row of tablets daily.  Tonight, please take 1 tablet of baclofen before you go to bed, this is a muscle relaxer and should provide you with a nice, restorative sleep.  If you feel that another injection of ketorolac would be of benefit, please come back to urgent care and we will be happy to provide this for you.    I have provided you with a note to be out of work for the next 3 days.  I can also provide you with a second note if you decide that you need to come back for the second injection.  Conservative care at home includes deep tissue massage either with thumbs, elbows or a massage gun (picture included).  You may also wish to invest in having a professional deep tissue massage.  Applying heat and or ice to the affected area may also provide you some relief.  You can also begin taking high doses of Advil (800 mg 3 times daily) on day 3 of the Medrol Dosepak if you feel this is needed.  I do not recommend stretching as this may make the spasming worse.  Thank you for visiting urgent care today, I hope you feel better soon.     ED Prescriptions     Medication Sig Dispense Auth. Provider   methylPREDNISolone (MEDROL DOSEPAK) 4 MG TBPK tablet Take 24 mg on day 1, 20 mg on day 2, 16 mg  on day 3, 12 mg on day 4, 8 mg on day 5, 4 mg on day 6. 21 tablet Lynden Oxford Scales, PA-C   baclofen (LIORESAL) 10 MG tablet Take 1 tablet (10 mg total) by mouth at bedtime for 7 days. 7 tablet Lynden Oxford Scales, PA-C      PDMP not reviewed this encounter.  Disposition Upon Discharge:  Patient presented with an acute illness with associated systemic symptoms and significant discomfort requiring urgent management. In my opinion, this is a condition that a prudent lay person (someone who possesses an average knowledge of health and medicine) may potentially expect to result in complications if not addressed urgently such as respiratory distress, impairment of bodily function or dysfunction of bodily organs.   Routine symptom specific, illness specific and/or disease specific instructions were discussed with the patient and/or caregiver at length.   As such, the patient has been evaluated and assessed, work-up was performed and treatment was provided in alignment with urgent care protocols and evidence based medicine.  Patient/parent/caregiver has been advised that the patient may require follow up for further testing and treatment if the symptoms continue  in spite of treatment, as clinically indicated and appropriate.  Patient/parent/caregiver has been advised to return to the [facility] or PCP in 2-3 days if no better; to PCP or the Emergency Department if new signs and symptoms develop, or if the current signs or symptoms continue to change or worsen for further workup, evaluation and treatment as clinically indicated and appropriate  The patient will follow up with their current PCP if and as advised. If the patient does not currently have a PCP we will assist them in obtaining one.   The patient may need specialty follow up with physical therapy if the symptoms continue, in spite of conservative treatment and management, for further workup, evaluation, consultation and treatment as clinically  indicated and appropriate.  Patient/parent/caregiver verbalized understanding and agreement of plan as discussed.  All questions were addressed during visit.  Please see discharge instructions below for further details of plan.  Condition: stable for discharge home Home: take medications as prescribed; routine discharge instructions as discussed; follow up as advised.    Lynden Oxford Scales, [place] 07/04/21 [phone]

## 2021-10-12 ENCOUNTER — Other Ambulatory Visit (HOSPITAL_BASED_OUTPATIENT_CLINIC_OR_DEPARTMENT_OTHER): Payer: Self-pay | Admitting: Family Medicine

## 2021-10-12 DIAGNOSIS — Z1231 Encounter for screening mammogram for malignant neoplasm of breast: Secondary | ICD-10-CM

## 2021-10-13 ENCOUNTER — Encounter (HOSPITAL_BASED_OUTPATIENT_CLINIC_OR_DEPARTMENT_OTHER): Payer: Self-pay

## 2021-10-13 ENCOUNTER — Ambulatory Visit (HOSPITAL_BASED_OUTPATIENT_CLINIC_OR_DEPARTMENT_OTHER)
Admission: RE | Admit: 2021-10-13 | Discharge: 2021-10-13 | Disposition: A | Discharge status: Home / Self Care | Payer: 59 | Source: Ambulatory Visit | Attending: Family Medicine | Admitting: Family Medicine

## 2021-10-13 ENCOUNTER — Other Ambulatory Visit: Payer: Self-pay

## 2021-10-13 DIAGNOSIS — Z1231 Encounter for screening mammogram for malignant neoplasm of breast: Secondary | ICD-10-CM | POA: Diagnosis not present

## 2022-02-08 DIAGNOSIS — Z0000 Encounter for general adult medical examination without abnormal findings: Secondary | ICD-10-CM | POA: Diagnosis not present

## 2022-02-09 DIAGNOSIS — Z0000 Encounter for general adult medical examination without abnormal findings: Secondary | ICD-10-CM | POA: Diagnosis not present

## 2022-02-09 DIAGNOSIS — Z13220 Encounter for screening for lipoid disorders: Secondary | ICD-10-CM | POA: Diagnosis not present

## 2022-12-17 ENCOUNTER — Ambulatory Visit: Payer: Commercial Managed Care - PPO | Admitting: Obstetrics and Gynecology

## 2023-01-02 ENCOUNTER — Ambulatory Visit: Payer: Commercial Managed Care - PPO | Admitting: Family Medicine

## 2023-02-01 ENCOUNTER — Ambulatory Visit: Payer: Commercial Managed Care - PPO | Admitting: Family Medicine

## 2023-03-06 DIAGNOSIS — Z0000 Encounter for general adult medical examination without abnormal findings: Secondary | ICD-10-CM | POA: Diagnosis not present

## 2023-03-06 DIAGNOSIS — Z833 Family history of diabetes mellitus: Secondary | ICD-10-CM | POA: Diagnosis not present

## 2023-03-07 ENCOUNTER — Other Ambulatory Visit (HOSPITAL_COMMUNITY)
Admission: RE | Admit: 2023-03-07 | Discharge: 2023-03-07 | Disposition: A | Discharge status: Home / Self Care | Payer: 59 | Source: Ambulatory Visit | Attending: Obstetrics and Gynecology | Admitting: Obstetrics and Gynecology

## 2023-03-07 ENCOUNTER — Encounter: Payer: Self-pay | Admitting: Family Medicine

## 2023-03-07 ENCOUNTER — Ambulatory Visit: Payer: 59 | Admitting: Family Medicine

## 2023-03-07 VITALS — BP 126/65 | HR 66 | Ht 64.0 in | Wt 256.0 lb

## 2023-03-07 DIAGNOSIS — D259 Leiomyoma of uterus, unspecified: Secondary | ICD-10-CM | POA: Diagnosis not present

## 2023-03-07 DIAGNOSIS — Z01419 Encounter for gynecological examination (general) (routine) without abnormal findings: Secondary | ICD-10-CM | POA: Insufficient documentation

## 2023-03-07 DIAGNOSIS — T8332XA Displacement of intrauterine contraceptive device, initial encounter: Secondary | ICD-10-CM

## 2023-03-07 DIAGNOSIS — Z1339 Encounter for screening examination for other mental health and behavioral disorders: Secondary | ICD-10-CM

## 2023-03-07 NOTE — Progress Notes (Signed)
Patient concerned if her fibroid. Armandina Stammer RN

## 2023-03-07 NOTE — Progress Notes (Signed)
ANNUAL EXAM Patient name: Emma Espinoza MRN [id number]  Date of birth: 12/27/1975 Chief Complaint:   Annual Exam  History of Present Illness:   Emma Espinoza is a 47 y.o.  [id number]  female  being seen today for a routine annual exam.  Current complaints: Had [place] in 2019 with 2 small fiboids. Does have heavier bleeding with copper IUD, but does not want hormonal IUD. Had IUD placed about 14 years ago with 1 exchange. No perimenopausal symptoms.  Patient's last menstrual period was 03/02/2023.    Last pap >3 years. Results were:  normal per patient . H/O abnormal pap: no Last mammogram: 2023     03/07/2023    9:25 AM  Depression screen PHQ 2/9  Decreased Interest 0  Down, Depressed, Hopeless 0  PHQ - 2 Score 0  Altered sleeping 0  Tired, decreased energy 1  Change in appetite 0  Feeling bad or failure about yourself  1  Trouble concentrating 0  Moving slowly or fidgety/restless 0  Suicidal thoughts 0  PHQ-9 Score 2        03/07/2023    9:25 AM  GAD 7 : Generalized Anxiety Score  Nervous, Anxious, on Edge 0  Control/stop worrying 0  Worry too much - different things 0  Trouble relaxing 0  Restless 0  Easily annoyed or irritable 0  Afraid - awful might happen 0  Total GAD 7 Score 0     Review of Systems:   Pertinent items are noted in HPI Denies any headaches, blurred vision, fatigue, shortness of breath, chest pain, abdominal pain, abnormal vaginal discharge/itching/odor/irritation, problems with periods, bowel movements, urination, or intercourse unless otherwise stated above. Pertinent History Reviewed:  Reviewed past medical,surgical, social and family history.  Reviewed problem list, medications and allergies. Physical Assessment:   Vitals:   03/07/23 0917  BP: 126/65  Pulse: 66  Weight: 256 lb (116.1 kg)  Height: 5\' 4"  (1.626 m)  Body mass index is 43.94 kg/m.        Physical Examination:   General appearance - well appearing, and in no  distress  Mental status - alert, oriented to person, place, and time  Psych:  She has a normal mood and affect  Skin - warm and dry, normal color, no suspicious lesions noted  Chest - effort normal, all lung fields clear to auscultation bilaterally  Heart - normal rate and regular rhythm  Neck:  midline trachea, no thyromegaly or nodules  Breasts - breasts appear normal, no suspicious masses, no skin or nipple changes or axillary nodes  Abdomen - soft, nontender, nondistended, no masses or organomegaly  Pelvic - VULVA: normal appearing vulva with no masses, tenderness or lesions  VAGINA: normal appearing vagina with normal color and discharge, no lesions  CERVIX: normal appearing cervix without discharge or lesions, no CMT  Thin prep pap is done with HR HPV cotesting  IUD strings not seen.  UTERUS: uterus is felt to be normal size, shape, consistency and nontender   ADNEXA: No adnexal masses or tenderness noted.  Extremities:  No swelling or varicosities noted  Chaperone present for exam  Assessment & Plan:  1. Well woman exam with routine gynecological exam - Cytology - PAP( [place]) - MM 3D SCREENING MAMMOGRAM BILATERAL BREAST; Future  2. Intrauterine contraceptive device threads lost, initial encounter Will check [place] - US PELVIC COMPLETE WITH TRANSVAGINAL; Future  3. Uterine leiomyoma, unspecified location F/u US. Discussed LnIUD, but patient not interested at  the moment. - US PELVIC COMPLETE WITH TRANSVAGINAL; Future   Labs/procedures today:   Orders Placed This Encounter  Procedures   MM 3D SCREENING MAMMOGRAM BILATERAL BREAST   US PELVIC COMPLETE WITH TRANSVAGINAL    Meds: No orders of the defined types were placed in this encounter.   Follow-up: No follow-ups on file.  Levie Heritage, DO 03/07/2023 9:54 AM

## 2023-03-08 LAB — CYTOLOGY - PAP
Comment: NEGATIVE
Diagnosis: NEGATIVE
High risk HPV: NEGATIVE

## 2023-03-18 ENCOUNTER — Ambulatory Visit (HOSPITAL_BASED_OUTPATIENT_CLINIC_OR_DEPARTMENT_OTHER)
Admission: RE | Admit: 2023-03-18 | Discharge: 2023-03-18 | Disposition: A | Discharge status: Home / Self Care | Payer: 59 | Source: Ambulatory Visit | Attending: Family Medicine | Admitting: Family Medicine

## 2023-03-18 ENCOUNTER — Encounter (HOSPITAL_BASED_OUTPATIENT_CLINIC_OR_DEPARTMENT_OTHER): Payer: Self-pay

## 2023-03-18 DIAGNOSIS — D251 Intramural leiomyoma of uterus: Secondary | ICD-10-CM | POA: Diagnosis not present

## 2023-03-18 DIAGNOSIS — N854 Malposition of uterus: Secondary | ICD-10-CM | POA: Diagnosis not present

## 2023-03-18 DIAGNOSIS — T8332XA Displacement of intrauterine contraceptive device, initial encounter: Secondary | ICD-10-CM

## 2023-03-18 DIAGNOSIS — Z01419 Encounter for gynecological examination (general) (routine) without abnormal findings: Secondary | ICD-10-CM | POA: Insufficient documentation

## 2023-03-18 DIAGNOSIS — D259 Leiomyoma of uterus, unspecified: Secondary | ICD-10-CM | POA: Diagnosis not present

## 2023-03-18 DIAGNOSIS — Z1231 Encounter for screening mammogram for malignant neoplasm of breast: Secondary | ICD-10-CM | POA: Diagnosis not present

## 2023-03-18 DIAGNOSIS — D252 Subserosal leiomyoma of uterus: Secondary | ICD-10-CM | POA: Diagnosis not present

## 2023-05-11 ENCOUNTER — Ambulatory Visit
Admission: EM | Admit: 2023-05-11 | Discharge: 2023-05-11 | Disposition: A | Discharge status: Home / Self Care | Payer: 59 | Attending: Internal Medicine | Admitting: Internal Medicine

## 2023-05-11 DIAGNOSIS — R2242 Localized swelling, mass and lump, left lower limb: Secondary | ICD-10-CM | POA: Diagnosis not present

## 2023-05-11 NOTE — ED Triage Notes (Signed)
Pt reports lump on leg x 5 months. States the lump keeps growing up. Denies pain.

## 2023-05-11 NOTE — ED Provider Notes (Signed)
[facility]    CSN: [id number] Arrival date & time: 05/11/23  0815      History   Chief Complaint Chief Complaint  Patient presents with   lump on leg    HPI Emma Espinoza is a 47 y.o. female presents for "lump on thigh" for 5 months.  Patient reports 5 months of a growing nonpainful firmness on her lateral left thigh.  States she can visibly see it when she compares her thighs and can feel it when she presses on her leg.  Denies any injury, pain, warmth, erythema, fevers or chills.  No change in activity such as exercise.  No dietary changes.  No other concerns at this time.  HPI  History reviewed. No pertinent past medical history.  Patient Active Problem List   Diagnosis Date Noted   Pelvic pain in female 02/27/2017   Vitamin D deficiency 02/27/2017    History reviewed. No pertinent surgical history.  OB History     Gravida  2   Para  2   Term  2   Preterm      AB      Living  2      SAB      IAB      Ectopic      Multiple      Live Births  2            Home Medications    Prior to Admission medications   Medication Sig Start Date End Date Taking? Authorizing Provider  ibuprofen (ADVIL) 200 MG tablet Take 200 mg by mouth every 6 (six) hours as needed.    [provider]  methylPREDNISolone (MEDROL DOSEPAK) 4 MG TBPK tablet Take 24 mg on day 1, 20 mg on day 2, 16 mg on day 3, 12 mg on day 4, 8 mg on day 5, 4 mg on day 6. 07/03/21   Theadora Rama Scales, PA-C    Family History Family History  Problem Relation Age of Onset   Hypertension Mother    Diabetes Mother    Diabetes Sister    Hypertension Sister    Cancer Neg Hx     Social History Social History   Tobacco Use   Smoking status: Never   Smokeless tobacco: Never  Vaping Use   Vaping status: Never Used  Substance Use Topics   Alcohol use: Never   Drug use: Never     Allergies   Patient has no known allergies.   Review of Systems Review  of Systems  Musculoskeletal:        Firmness of thigh     Physical Exam Triage Vital Signs ED Triage Vitals  Encounter Vitals Group     BP 05/11/23 0823 138/82     Systolic BP Percentile --      Diastolic BP Percentile --      Pulse Rate 05/11/23 0823 70     Resp 05/11/23 0823 18     Temp 05/11/23 0823 98.2 F (36.8 C)     Temp Source 05/11/23 0823 Oral     SpO2 05/11/23 0823 95 %     Weight --      Height --      Head Circumference --      Peak Flow --      Pain Score 05/11/23 0825 0     Pain Loc --      Pain Education --      Exclude from [employer]  Chart --    No data found.  Updated Vital Signs BP 138/82 (BP Location: Right Arm)   Pulse 70   Temp 98.2 F (36.8 C) (Oral)   Resp 18   SpO2 95%   Visual Acuity Right Eye Distance:   Left Eye Distance:   Bilateral Distance:    Right Eye Near:   Left Eye Near:    Bilateral Near:     Physical Exam Vitals and nursing note reviewed.  Constitutional:      General: She is not in acute distress.    Appearance: Normal appearance. She is not ill-appearing, toxic-appearing or diaphoretic.  HENT:     Head: Normocephalic and atraumatic.  Eyes:     Pupils: Pupils are equal, round, and reactive to light.  Cardiovascular:     Rate and Rhythm: Normal rate.  Pulmonary:     Effort: Pulmonary effort is normal.  Musculoskeletal:       Legs:     Comments: Very minimal light swelling of lateral left thigh.  With palpation no unusual firmness, induration, fluctuance.  No erythema or warmth.  Skin:    General: Skin is warm and dry.  Neurological:     General: No focal deficit present.     Mental Status: She is alert and oriented to person, place, and time.  Psychiatric:        Mood and Affect: Mood normal.        Behavior: Behavior normal.      UC Treatments / Results  Labs (all labs ordered are listed, but only abnormal results are displayed) Labs Reviewed - No data to display  EKG   Radiology No results  found.  Procedures Procedures (including critical care time)  Medications Ordered in UC Medications - No data to display  Initial Impression / Assessment and Plan / UC Course  I have reviewed the triage vital signs and the nursing notes.  Pertinent labs & imaging results that were available during my care of the patient were reviewed by me and considered in my medical decision making (see chart for details).     Reviewed exam and symptoms with patient.  No red flags.  No sign of abscess/infection/hematoma on exam.  Unclear cause of patient concern.  Advised PCP follow-up for further imaging if indicated.  No indication at this time for emergent evaluation.  She was instructed to go to the ER for any worsening symptoms that occur prior to her seeing her PCP and she verbalized understanding. Final Clinical Impressions(s) / UC Diagnoses   Final diagnoses:  Thigh lump, left     Discharge Instructions      Please follow-up with your PCP for further workup your concern such as possible ultrasound.  Please go to the emergency room if you develop any worsening symptoms prior to seeing your PCP     ED Prescriptions   None    PDMP not reviewed this encounter.   Radford Pax, NP 05/11/23 [phone]

## 2023-05-11 NOTE — Discharge Instructions (Signed)
Please follow-up with your PCP for further workup your concern such as possible ultrasound.  Please go to the emergency room if you develop any worsening symptoms prior to seeing your PCP

## 2023-05-22 ENCOUNTER — Encounter: Payer: Self-pay | Admitting: Physician Assistant

## 2023-05-22 ENCOUNTER — Ambulatory Visit: Payer: 59 | Admitting: Physician Assistant

## 2023-05-22 VITALS — BP 116/68 | HR 58 | Temp 98.3°F | Resp 16 | Ht 64.0 in | Wt 259.5 lb

## 2023-05-22 DIAGNOSIS — E559 Vitamin D deficiency, unspecified: Secondary | ICD-10-CM | POA: Diagnosis not present

## 2023-05-22 DIAGNOSIS — R2242 Localized swelling, mass and lump, left lower limb: Secondary | ICD-10-CM | POA: Insufficient documentation

## 2023-05-22 DIAGNOSIS — Z6841 Body Mass Index (BMI) 40.0 and over, adult: Secondary | ICD-10-CM | POA: Diagnosis not present

## 2023-05-22 DIAGNOSIS — Z1211 Encounter for screening for malignant neoplasm of colon: Secondary | ICD-10-CM

## 2023-05-22 DIAGNOSIS — D509 Iron deficiency anemia, unspecified: Secondary | ICD-10-CM | POA: Insufficient documentation

## 2023-05-22 NOTE — Assessment & Plan Note (Signed)
Slight anemia noted on previous lab work,-Encouraged to consume iron-rich foods. -Consider occasional iron supplementation.

## 2023-05-22 NOTE — Assessment & Plan Note (Signed)
Firm mass present for 3-5 months, no history of trauma.  -Order ultrasound of left lower extremity soft tissues to further evaluate.

## 2023-05-22 NOTE — Assessment & Plan Note (Signed)
Past low levels, no recent testing. -Advise over-the-counter Vitamin D supplementation at 1000-2000 units daily.

## 2023-05-22 NOTE — Assessment & Plan Note (Signed)
Patient has a history of weight loss and regain. Expressed desire for sustainable weight loss. -Referral to a nutritionist for personalized dietary advice. -Encouraged to incorporate more physical activity into routine. -Discussed the concept of intuitive eating and making healthier choices in moderation.

## 2023-05-22 NOTE — Progress Notes (Signed)
New patient visit   Patient: Emma Espinoza   DOB: [birth year]   47 y.o. Female  MRN: [id number] Visit Date: 05/22/2023  Today's healthcare provider: Alfredia Ferguson, PA-C   Cc. New patient, leg mass, weight loss  Subjective    Emma Espinoza is a 47 y.o. female who presents today as a new patient to establish care.  HPI  Pt was sen at urgent care 9/21 for a lump on her left thigh, it was recommended to follow up with a PCP.  Discussed the use of AI scribe software for clinical note transcription with the patient, who gave verbal consent to proceed.  History of Present Illness   The patient presents with a firm mass on her left upper thigh that has been present for approximately three to five months. The mass has recently changed in size and firmness, becoming less firm and slightly wider. The patient denies any trauma or injury to the area.    The patient also reports a desire to lose weight and maintain the weight loss. She has previously lost approximately thirty pounds through dietary changes, primarily limiting carbohydrate and sugar intake, but has since regained the weight and an additional eight pounds. The patient expresses frustration with maintaining weight loss and is seeking advice on sustainable weight loss strategies.       Past Medical History:  Diagnosis Date   Anemia, unspecified    Hyperglycemia    Hyperlipidemia    Uterine leiomyoma    Vitamin D deficiency    Past Surgical History:  Procedure Laterality Date   NO PAST SURGERIES     Family Status  Relation Name Status   Mother  (Not Specified)   Sister  (Not Specified)   Neg Hx  (Not Specified)  No partnership data on file   Family History  Problem Relation Age of Onset   Hypertension Mother    Diabetes Mother    Diabetes Sister    Hypertension Sister    Cancer Neg Hx    Social History   Socioeconomic History   Marital status: Married    Spouse name: Not on file   Number of  children: Not on file   Years of education: Not on file   Highest education level: Not on file  Occupational History   Not on file  Tobacco Use   Smoking status: Never   Smokeless tobacco: Never  Vaping Use   Vaping status: Never Used  Substance and Sexual Activity   Alcohol use: Never   Drug use: Never   Sexual activity: Yes    Birth control/protection: I.U.D.  Other Topics Concern   Not on file  Social History Narrative   Not on file   Social Determinants of Health   Financial Resource Strain: Not on file  Food Insecurity: Low Risk  (03/05/2023)   Received from [facility]   Hunger Vital Sign    Worried About Running Out of Food in the Last Year: Never true    Ran Out of Food in the Last Year: Never true  Transportation Needs: Not on file (03/05/2023)  Physical Activity: Not on file  Stress: Not on file  Social Connections: Not on file   Outpatient Medications Prior to Visit  Medication Sig   PARAGARD INTRAUTERINE COPPER IU by Intrauterine route.   [DISCONTINUED] ibuprofen (ADVIL) 200 MG tablet Take 200 mg by mouth every 6 (six) hours as needed. (Patient not taking: Reported on 05/22/2023)   [DISCONTINUED] methylPREDNISolone (  MEDROL DOSEPAK) 4 MG TBPK tablet Take 24 mg on day 1, 20 mg on day 2, 16 mg on day 3, 12 mg on day 4, 8 mg on day 5, 4 mg on day 6. (Patient not taking: Reported on 05/22/2023)   No facility-administered medications prior to visit.   No Known Allergies  Immunization History  Administered Date(s) Administered   Influenza-Unspecified 06/13/2020   PFIZER(Purple Top)SARS-COV-2 Vaccination 04/15/2020, 05/11/2020   Tdap 11/18/2008, 03/25/2018    Health Maintenance  Topic Date Due   HIV Screening  Never done   Hepatitis C Screening  Never done   Colonoscopy  Never done   COVID-19 Vaccine (3 - 2023-24 season) 04/21/2023   INFLUENZA VACCINE  11/18/2023 (Originally 03/21/2023)   MAMMOGRAM  03/17/2024   Cervical Cancer Screening (HPV/Pap Cotest)   03/06/2028   DTaP/Tdap/Td (3 - Td or Tdap) 03/25/2028   HPV VACCINES  Aged Out    Patient Care Team: Alfredia Ferguson, PA-C as PCP - General (Physician Assistant)  Review of Systems  Constitutional:  Negative for fatigue and fever.  Respiratory:  Negative for cough and shortness of breath.   Cardiovascular:  Negative for chest pain and leg swelling.  Gastrointestinal:  Negative for abdominal pain.  Neurological:  Negative for dizziness and headaches.      Objective    BP 116/68   Pulse (!) 58   Temp 98.3 F (36.8 C) (Oral)   Resp 16   Ht 5\' 4"  (1.626 m)   Wt 259 lb 8 oz (117.7 kg)   SpO2 98%   BMI 44.54 kg/m   Physical Exam Constitutional:      General: She is awake.     Appearance: She is well-developed.  HENT:     Head: Normocephalic.  Eyes:     Conjunctiva/sclera: Conjunctivae normal.  Cardiovascular:     Rate and Rhythm: Normal rate and regular rhythm.     Heart sounds: Normal heart sounds.  Pulmonary:     Effort: Pulmonary effort is normal.     Breath sounds: Normal breath sounds.  Skin:    General: Skin is warm.  Neurological:     Mental Status: She is alert and oriented to person, place, and time.  Psychiatric:        Attention and Perception: Attention normal.        Mood and Affect: Mood normal.        Speech: Speech normal.        Behavior: Behavior is cooperative.     Depression Screen    05/22/2023    9:31 AM 03/07/2023    9:25 AM  PHQ 2/9 Scores  PHQ - 2 Score 0 0  PHQ- 9 Score  2   No results found for any visits on 05/22/23.  Assessment & Plan      Problem List Items Addressed This Visit       Other   Vitamin D deficiency    Past low levels, no recent testing. -Advise over-the-counter Vitamin D supplementation at 1000-2000 units daily.      IDA (iron deficiency anemia)    Slight anemia noted on previous lab work,-Encouraged to consume iron-rich foods. -Consider occasional iron supplementation.      Morbid obesity (HCC)     Patient has a history of weight loss and regain. Expressed desire for sustainable weight loss. -Referral to a nutritionist for personalized dietary advice. -Encouraged to incorporate more physical activity into routine. -Discussed the concept of intuitive eating and making  healthier choices in moderation.      Relevant Orders   Amb ref to Medical Nutrition Therapy-MNT   Mass of left thigh - Primary    Firm mass present for 3-5 months, no history of trauma.  -Order ultrasound of left lower extremity soft tissues to further evaluate.      Relevant Orders   [place] LT LOWER EXTREM LTD SOFT TISSUE NON VASCULAR   Other Visit Diagnoses     Colon cancer screening       Relevant Orders   Ambulatory referral to Gastroenterology        Colon cancer screening Patient is due for screening. -Plan for colonoscopy.        Return in about 9 months (around 03/03/2024), or if symptoms worsen or fail to improve, for CPE.     I, Alfredia Ferguson, PA-C have reviewed all documentation for this visit. The documentation on  05/22/23   for the exam, diagnosis, procedures, and orders are all accurate and complete.    Alfredia Ferguson, PA-C  [facility] at [facility] [phone] (phone) [phone] (fax)  [facility]

## 2023-06-01 ENCOUNTER — Ambulatory Visit (HOSPITAL_BASED_OUTPATIENT_CLINIC_OR_DEPARTMENT_OTHER)
Admission: RE | Admit: 2023-06-01 | Discharge: 2023-06-01 | Disposition: A | Discharge status: Home / Self Care | Payer: 59 | Source: Ambulatory Visit | Attending: Physician Assistant | Admitting: Physician Assistant

## 2023-06-01 DIAGNOSIS — R2242 Localized swelling, mass and lump, left lower limb: Secondary | ICD-10-CM | POA: Insufficient documentation

## 2023-06-24 ENCOUNTER — Other Ambulatory Visit: Payer: Self-pay | Admitting: Physician Assistant

## 2023-06-24 DIAGNOSIS — R2242 Localized swelling, mass and lump, left lower limb: Secondary | ICD-10-CM

## 2023-06-27 ENCOUNTER — Encounter: Payer: Self-pay | Admitting: Internal Medicine

## 2023-06-27 ENCOUNTER — Ambulatory Visit (HOSPITAL_COMMUNITY)
Admission: RE | Admit: 2023-06-27 | Discharge: 2023-06-27 | Disposition: A | Discharge status: Home / Self Care | Payer: 59 | Source: Ambulatory Visit | Attending: Physician Assistant | Admitting: Physician Assistant

## 2023-06-27 ENCOUNTER — Ambulatory Visit: Payer: 59

## 2023-06-27 VITALS — Ht 64.0 in | Wt 255.0 lb

## 2023-06-27 DIAGNOSIS — D259 Leiomyoma of uterus, unspecified: Secondary | ICD-10-CM | POA: Diagnosis not present

## 2023-06-27 DIAGNOSIS — R2242 Localized swelling, mass and lump, left lower limb: Secondary | ICD-10-CM | POA: Insufficient documentation

## 2023-06-27 DIAGNOSIS — Z1211 Encounter for screening for malignant neoplasm of colon: Secondary | ICD-10-CM

## 2023-06-27 MED ORDER — GADOBUTROL 1 MMOL/ML IV SOLN
10.0000 mL | Freq: Once | INTRAVENOUS | Status: AC | PRN
  Administered 2023-06-27: 10 mL via INTRAVENOUS

## 2023-06-27 MED ORDER — NA SULFATE-K SULFATE-MG SULF 17.5-3.13-1.6 GM/177ML PO SOLN
1.0000 | Freq: Once | ORAL | 0 refills | Status: AC
Start: 2023-06-27 — End: 2023-06-27

## 2023-06-27 NOTE — Progress Notes (Addendum)
Pre visit completed in person; Patient verified name, DOB, and address; No egg or soy allergy known to patient  No issues known to pt with past sedation with any surgeries or procedures; Patient denies ever being told they had issues or difficulty with intubation;  No FH of Malignant Hyperthermia; Pt is not on diet pills; Pt is not on home 02;  Pt is not on blood thinners;  Pt denies issues with constipation;  No A fib or A flutter; Have any cardiac testing pending--NO Insurance verified during PV appt--- [employer] Save Pt can ambulate without assistance;  Pt denies use of chewing tobacco; Discussed diabetic/weight loss medication holds; Discussed NSAID holds; Checked BMI to be less than 50; Pt instructed to use Singlecare.com or GoodRx for a price reduction on prep;  Patient's chart reviewed by Cathlyn Parsons CNRA prior to previsit and patient appropriate for the [facility];  Pre visit completed and red dot placed by patient's name on their procedure day (on provider's schedule);  Instructions printed and given to patient during PV appt;  GoodRx [facility] coupon given during PV appt;

## 2023-07-03 ENCOUNTER — Encounter: Payer: 59 | Admitting: Internal Medicine

## 2023-07-17 ENCOUNTER — Other Ambulatory Visit: Payer: Self-pay | Admitting: Physician Assistant

## 2023-07-17 ENCOUNTER — Telehealth: Payer: Self-pay

## 2023-07-17 ENCOUNTER — Telehealth: Payer: Self-pay | Admitting: Physician Assistant

## 2023-07-17 DIAGNOSIS — R2242 Localized swelling, mass and lump, left lower limb: Secondary | ICD-10-CM

## 2023-07-17 NOTE — Telephone Encounter (Signed)
Spoke w/pt.

## 2023-07-17 NOTE — Telephone Encounter (Signed)
Pt called back. Amber made aware but pt disconnected before a transfer could be made.

## 2023-07-17 NOTE — Telephone Encounter (Signed)
See other telephone note.

## 2023-07-17 NOTE — Telephone Encounter (Signed)
Aware, spoke w/ pt

## 2023-07-17 NOTE — Telephone Encounter (Signed)
Received call from [facility] radiology- wanted to make sure Emma Espinoza is aware of below regarding MRI from 06/27/23:   IMPRESSION: 1. 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 Referral to an orthopedic tumor specialist is recommended for potential biopsy and resection. 2. Uterine fibroids.     Electronically Signed   By: Gaylyn Rong M.D.   On: 07/17/2023 14:01

## 2023-07-17 NOTE — Telephone Encounter (Signed)
Called pt with mri results but sent to voicemail, left message;  please do not relay results to patient and transfer to me if she calls back!

## 2023-07-23 ENCOUNTER — Telehealth: Payer: Self-pay | Admitting: Physician Assistant

## 2023-07-23 NOTE — Telephone Encounter (Signed)
Called patient and LVM.  Just wanted to double check that it is the [facility] before sending fax

## 2023-07-23 NOTE — Telephone Encounter (Signed)
Patient called and states she has an appt with ortho for lump on leg and office needs the imaging sent to their office. She wants to make sure imaging would be there int ime of her appt.

## 2023-07-23 NOTE — Telephone Encounter (Signed)
Pt called and confirmed that its [facility] with Posey Rea.

## 2023-07-24 ENCOUNTER — Ambulatory Visit: Payer: 59 | Admitting: Physician Assistant

## 2023-07-24 ENCOUNTER — Ambulatory Visit: Payer: 59 | Admitting: Family Medicine

## 2023-07-25 DIAGNOSIS — R2242 Localized swelling, mass and lump, left lower limb: Secondary | ICD-10-CM | POA: Diagnosis not present

## 2023-07-31 ENCOUNTER — Encounter: Payer: 59 | Admitting: Internal Medicine

## 2023-08-02 DIAGNOSIS — M7989 Other specified soft tissue disorders: Secondary | ICD-10-CM | POA: Diagnosis not present

## 2023-08-02 DIAGNOSIS — E119 Type 2 diabetes mellitus without complications: Secondary | ICD-10-CM | POA: Diagnosis not present

## 2023-08-02 DIAGNOSIS — M799 Soft tissue disorder, unspecified: Secondary | ICD-10-CM | POA: Diagnosis not present

## 2023-08-02 DIAGNOSIS — E785 Hyperlipidemia, unspecified: Secondary | ICD-10-CM | POA: Diagnosis not present

## 2023-08-02 DIAGNOSIS — Z79899 Other long term (current) drug therapy: Secondary | ICD-10-CM | POA: Diagnosis not present

## 2023-08-02 DIAGNOSIS — R2242 Localized swelling, mass and lump, left lower limb: Secondary | ICD-10-CM | POA: Diagnosis not present

## 2023-08-02 HISTORY — PX: MASS EXCISION: SHX2000

## 2023-08-19 ENCOUNTER — Telehealth: Payer: Self-pay | Admitting: Internal Medicine

## 2023-08-19 NOTE — Telephone Encounter (Signed)
Call returned.  LVM

## 2023-08-19 NOTE — Telephone Encounter (Signed)
Patient called and stated that she has some question and is needing for the pre-op nurse to return her call. Please advise.

## 2023-08-19 NOTE — Telephone Encounter (Signed)
PT has some concerns about the procedure. She recently has a surgery done and she is concerned about the sedation. Please advise.

## 2023-08-19 NOTE — Telephone Encounter (Signed)
LVM to call back phone forwarding to VM

## 2023-08-19 NOTE — Telephone Encounter (Signed)
Spoke with patient recent surgery on outer left thigh pt made aware of positioning during colonoscopy. Reports swelling still and inability to lay on her left side for long periods. Pt has not been seen for f/u apt for surgery as of yet. Pt r/s until she is seen by her surgeon on 2/13.

## 2023-08-20 ENCOUNTER — Encounter: Payer: 59 | Admitting: Internal Medicine

## 2023-10-15 ENCOUNTER — Ambulatory Visit (AMBULATORY_SURGERY_CENTER): Payer: 59 | Admitting: Internal Medicine

## 2023-10-15 ENCOUNTER — Encounter: Payer: Self-pay | Admitting: Internal Medicine

## 2023-10-15 VITALS — BP 155/90 | HR 71 | Temp 97.2°F | Resp 22 | Ht 64.0 in | Wt 255.0 lb

## 2023-10-15 DIAGNOSIS — D123 Benign neoplasm of transverse colon: Secondary | ICD-10-CM | POA: Diagnosis not present

## 2023-10-15 DIAGNOSIS — K648 Other hemorrhoids: Secondary | ICD-10-CM

## 2023-10-15 DIAGNOSIS — E785 Hyperlipidemia, unspecified: Secondary | ICD-10-CM | POA: Diagnosis not present

## 2023-10-15 DIAGNOSIS — Z1211 Encounter for screening for malignant neoplasm of colon: Secondary | ICD-10-CM

## 2023-10-15 MED ORDER — SODIUM CHLORIDE 0.9 % IV SOLN: 500.0000 mL | Freq: Once | INTRAVENOUS | Status: DC

## 2023-10-15 NOTE — Progress Notes (Signed)
 GASTROENTEROLOGY PROCEDURE H&P NOTE   Primary Care Physician: Alfredia Ferguson, PA-C    Reason for Procedure:   Colon cancer screening  Plan:    Colonoscopy  Patient is appropriate for endoscopic procedure(s) in the ambulatory ([facility]) setting.  The nature of the procedure, as well as the risks, benefits, and alternatives were carefully and thoroughly reviewed with the patient. Ample time for discussion and questions allowed. The patient understood, was satisfied, and agreed to proceed.     HPI: Emma Espinoza is a 48 y.o. female who presents for colonoscopy for colon cancer screening. She does have hemorrhoids but denies any blood in the stools. Denies changes in bowel habits or unintentional weight loss. Denies family history of colon cancer. This is her first colonoscopy.   Past Medical History:  Diagnosis Date   Anemia, unspecified    hx of   Hyperglycemia    Hyperlipidemia    Uterine leiomyoma    Vitamin D deficiency     Past Surgical History:  Procedure Laterality Date   NO PAST SURGERIES      Prior to Admission medications   Medication Sig Start Date End Date Taking? Authorizing Provider  Multiple Vitamin (MULTIVITAMIN PO) Take 1 tablet by mouth daily at 6 (six) AM.    [provider]  PARAGARD INTRAUTERINE COPPER IU by Intrauterine route.    [provider]    Current Outpatient Medications  Medication Sig Dispense Refill   Multiple Vitamin (MULTIVITAMIN PO) Take 1 tablet by mouth daily at 6 (six) AM.     PARAGARD INTRAUTERINE COPPER IU by Intrauterine route.     Current Facility-Administered Medications  Medication Dose Route Frequency Provider Last Rate Last Admin   0.9 %  sodium chloride infusion  500 mL Intravenous Once Imogene Burn, MD        Allergies as of 10/15/2023   (No Known Allergies)    Family History  Problem Relation Age of Onset   Hypertension Mother    Diabetes Mother    Diabetes Sister    Hypertension  Sister    Cancer Neg Hx    Colon polyps Neg Hx    Colon cancer Neg Hx    Esophageal cancer Neg Hx    Stomach cancer Neg Hx    Rectal cancer Neg Hx     Social History   Socioeconomic History   Marital status: Married    Spouse name: Not on file   Number of children: Not on file   Years of education: Not on file   Highest education level: Bachelor's degree (e.g., BA, AB, BS)  Occupational History   Not on file  Tobacco Use   Smoking status: Never   Smokeless tobacco: Never  Vaping Use   Vaping status: Never Used  Substance and Sexual Activity   Alcohol use: Never   Drug use: Never   Sexual activity: Yes    Birth control/protection: I.U.D.  Other Topics Concern   Not on file  Social History Narrative   Not on file   Social Drivers of Health   Financial Resource Strain: Low Risk  (07/23/2023)   Overall Financial Resource Strain (CARDIA)    Difficulty of Paying Living Expenses: Not hard at all  Food Insecurity: No Food Insecurity (07/23/2023)   Hunger Vital Sign    Worried About Running Out of Food in the Last Year: Never true    Ran Out of Food in the Last Year: Never true  Transportation Needs: No  Transportation Needs (07/23/2023)   PRAPARE - Administrator, Civil Service (Medical): No    Lack of Transportation (Non-Medical): No  Physical Activity: Unknown (07/23/2023)   Exercise Vital Sign    Days of Exercise per Week: 0 days    Minutes of Exercise per Session: Not on file  Stress: No Stress Concern Present (07/23/2023)   [employer] of Occupational Health - Occupational Stress Questionnaire    Feeling of Stress : Not at all  Social Connections: Socially Integrated (07/23/2023)   Social Connection and Isolation Panel [NHANES]    Frequency of Communication with Friends and Family: More than three times a week    Frequency of Social Gatherings with Friends and Family: Twice a week    Attends Religious Services: More than 4 times per year    Active  Member of [employer] or Organizations: Yes    Attends Engineer, structural: More than 4 times per year    Marital Status: Married  Catering manager Violence: Not on file    Physical Exam: Vital signs in last 24 hours: BP 122/81   Pulse 69   Temp (!) 97.2 F (36.2 C) (Temporal)   Ht 5\' 4"  (1.626 m)   Wt 255 lb (115.7 kg)   SpO2 99%   BMI 43.77 kg/m  GEN: NAD EYE: Sclerae anicteric ENT: MMM CV: Non-tachycardic Pulm: No increased work of breathing GI: Soft, NT/ND NEURO:  Alert & Oriented   Eulah Pont, MD [facility]  10/15/2023 10:13 AM

## 2023-10-15 NOTE — Progress Notes (Signed)
 Sedate, gd SR, tolerated procedure well, VSS, report to RN

## 2023-10-15 NOTE — Patient Instructions (Signed)

## 2023-10-15 NOTE — Op Note (Signed)
 [facility] Patient Name: Emma Espinoza Procedure Date: 10/15/2023 10:28 AM MRN: [id number] Endoscopist: Madelyn Brunner [place] , , [id number] Age: 48 Referring MD:  Date of Birth: 1976/02/01 Gender: Female Account #: [id number] Procedure:                Colonoscopy Indications:              Screening for colorectal malignant neoplasm, This                            is the patient's first colonoscopy Medicines:                Monitored Anesthesia Care Procedure:                Pre-Anesthesia Assessment:                           - Prior to the procedure, a History and Physical                            was performed, and patient medications and                            allergies were reviewed. The patient's tolerance of                            previous anesthesia was also reviewed. The risks                            and benefits of the procedure and the sedation                            options and risks were discussed with the patient.                            All questions were answered, and informed consent                            was obtained. Prior Anticoagulants: The patient has                            taken no anticoagulant or antiplatelet agents. ASA                            Grade Assessment: II - A patient with mild systemic                            disease. After reviewing the risks and benefits,                            the patient was deemed in satisfactory condition to                            undergo the procedure.  After obtaining informed consent, the colonoscope                            was passed under direct vision. Throughout the                            procedure, the patient's blood pressure, pulse, and                            oxygen saturations were monitored continuously. The                            CF [id number] #[id number] was introduced through the anus                            and advanced  to the the terminal ileum. The                            colonoscopy was performed without difficulty. The                            patient tolerated the procedure well. The quality                            of the bowel preparation was excellent. The                            terminal ileum, ileocecal valve, appendiceal                            orifice, and rectum were photographed. Scope In: 10:49:26 AM Scope Out: 11:07:36 AM Scope Withdrawal Time: 0 hours 14 minutes 49 seconds  Total Procedure Duration: 0 hours 18 minutes 10 seconds  Findings:                 The terminal ileum appeared normal.                           A 6 mm polyp was found in the transverse colon. The                            polyp was sessile. The polyp was removed with a                            cold snare. Resection and retrieval were complete.                           An 8 mm polyp was found in the transverse colon.                            The polyp was pedunculated. The polyp was removed                            with a  hot snare. Resection and retrieval were                            complete.                           Non-bleeding internal hemorrhoids were found during                            retroflexion. Complications:            No immediate complications. Estimated Blood Loss:     Estimated blood loss was minimal. Impression:               - The examined portion of the ileum was normal.                           - One 6 mm polyp in the transverse colon, removed                            with a cold snare. Resected and retrieved.                           - One 8 mm polyp in the transverse colon, removed                            with a hot snare. Resected and retrieved.                           - Non-bleeding internal hemorrhoids. Recommendation:           - Discharge patient to home (with escort).                           - Await pathology results.                           -  The findings and recommendations were discussed                            with the patient. Dr Particia Lather "[place]" [place],  10/15/2023 11:12:31 AM

## 2023-10-15 NOTE — Progress Notes (Signed)
 Called to room to assist during endoscopic procedure.  Patient ID and intended procedure confirmed with present staff. Received instructions for my participation in the procedure from the performing physician.

## 2023-10-16 ENCOUNTER — Telehealth: Payer: Self-pay | Admitting: *Deleted

## 2023-10-16 NOTE — Telephone Encounter (Signed)
  Follow up Call-     10/15/2023   10:07 AM  Call back number  Post procedure Call Back phone  # [phone]  Permission to leave phone message Yes     Patient questions:  Do you have a fever, pain , or abdominal swelling? No. Pain Score  0 *  Have you tolerated food without any problems? Yes.    Have you been able to return to your normal activities? Yes.    Do you have any questions about your discharge instructions: Diet   No. Medications  No. Follow up visit  No.  Do you have questions or concerns about your Care? No.  Actions: * If pain score is 4 or above: No action needed, pain <4.

## 2023-10-17 ENCOUNTER — Encounter: Payer: Self-pay | Admitting: Internal Medicine

## 2023-10-17 LAB — SURGICAL PATHOLOGY

## 2024-05-14 ENCOUNTER — Other Ambulatory Visit (HOSPITAL_BASED_OUTPATIENT_CLINIC_OR_DEPARTMENT_OTHER): Payer: Self-pay | Admitting: Family Medicine

## 2024-05-14 ENCOUNTER — Encounter (HOSPITAL_BASED_OUTPATIENT_CLINIC_OR_DEPARTMENT_OTHER): Payer: Self-pay | Admitting: Family Medicine

## 2024-05-14 DIAGNOSIS — Z1231 Encounter for screening mammogram for malignant neoplasm of breast: Secondary | ICD-10-CM

## 2024-05-18 ENCOUNTER — Encounter (HOSPITAL_BASED_OUTPATIENT_CLINIC_OR_DEPARTMENT_OTHER): Payer: Self-pay

## 2024-05-18 ENCOUNTER — Ambulatory Visit: Admitting: Obstetrics & Gynecology

## 2024-05-18 ENCOUNTER — Ambulatory Visit (HOSPITAL_BASED_OUTPATIENT_CLINIC_OR_DEPARTMENT_OTHER)
Admission: RE | Admit: 2024-05-18 | Discharge: 2024-05-18 | Disposition: A | Discharge status: Home / Self Care | Source: Ambulatory Visit | Attending: Family Medicine | Admitting: Family Medicine

## 2024-05-18 ENCOUNTER — Encounter: Payer: Self-pay | Admitting: Obstetrics & Gynecology

## 2024-05-18 VITALS — BP 132/79 | HR 65 | Ht 64.0 in | Wt 250.0 lb

## 2024-05-18 DIAGNOSIS — T8332XD Displacement of intrauterine contraceptive device, subsequent encounter: Secondary | ICD-10-CM | POA: Diagnosis not present

## 2024-05-18 DIAGNOSIS — R102 Pelvic and perineal pain: Secondary | ICD-10-CM | POA: Diagnosis not present

## 2024-05-18 DIAGNOSIS — Z1231 Encounter for screening mammogram for malignant neoplasm of breast: Secondary | ICD-10-CM | POA: Diagnosis not present

## 2024-05-18 DIAGNOSIS — Z01419 Encounter for gynecological examination (general) (routine) without abnormal findings: Secondary | ICD-10-CM | POA: Diagnosis not present

## 2024-05-18 DIAGNOSIS — D259 Leiomyoma of uterus, unspecified: Secondary | ICD-10-CM

## 2024-05-18 NOTE — Progress Notes (Signed)
 GYNECOLOGY ANNUAL PREVENTATIVE CARE ENCOUNTER NOTE  History:    Emma Espinoza is a 48 y.o. G50P2002 female here for a routine annual gynecologic exam.  Current complaints: occasional lower abdominal/pelvic pain, she is worried about her fibroids and wants an ultrasound to evaluate them.  Pain sometimes happens during intercourse, not every time but some occasions.  Reports occasional hot flashes, no other concerning perimenopausal symptoms.   Denies abnormal vaginal bleeding, discharge, or other gynecologic concerns.  Patient is a Engineer, civil (consulting) at [employer].  Gynecologic History Patient's last menstrual period was 04/27/2024 (exact date). Contraception:  Paragard  IUD that has been in place for 5 years Last Pap: 03/07/2023. Result was normal with negative HPV Last Mammogram: 03/18/2023.  Result was normal. Scheduled for next one today. Last Colonoscopy: 10/15/2023.  Result was benign  Obstetric History OB History  Gravida Para Term Preterm AB Living  2 2 2   2   SAB IAB Ectopic Multiple Live Births      2    # Outcome Date GA Lbr Len/2nd Weight Sex Type Anes PTL Lv  2 Term 2010 [redacted]w[redacted]d   F Vag-Spont EPI N LIV  1 Term 2008 [redacted]w[redacted]d   M Vag-Spont None N LIV    Past Medical History:  Diagnosis Date   Anemia, unspecified    hx of   Hyperglycemia    Hyperlipidemia    Uterine leiomyoma    Vitamin D deficiency     Past Surgical History:  Procedure Laterality Date   MASS EXCISION Left 08/02/2023   Left thigh    Current Outpatient Medications on File Prior to Visit  Medication Sig Dispense Refill   PARAGARD  INTRAUTERINE COPPER  IU by Intrauterine route.     Multiple Vitamin (MULTIVITAMIN PO) Take 1 tablet by mouth daily at 6 (six) AM. (Patient not taking: Reported on 05/18/2024)     No current facility-administered medications on file prior to visit.    No Known Allergies  Social History:  reports that she has never smoked. She has never used smokeless tobacco. She reports that she  does not drink alcohol and does not use drugs.  Family History  Problem Relation Age of Onset   Hypertension Mother    Diabetes Mother    Diabetes Sister    Hypertension Sister    Cancer Neg Hx    Colon polyps Neg Hx    Colon cancer Neg Hx    Esophageal cancer Neg Hx    Stomach cancer Neg Hx    Rectal cancer Neg Hx     The following portions of the patient's history were reviewed and updated as appropriate: allergies, current medications, past family history, past medical history, past social history, past surgical history and problem list.  Review of Systems Pertinent items noted in HPI and remainder of comprehensive ROS otherwise negative.  Physical Exam:  BP 132/79 (BP Location: Left Arm, Patient Position: Sitting, Cuff Size: Large)   Pulse 65   Ht 5' 4 (1.626 m)   Wt 250 lb (113.4 kg)   LMP 04/27/2024 (Exact Date)   BMI 42.91 kg/m  CONSTITUTIONAL: Well-developed, well-nourished female in no acute distress.  HENT:  Normocephalic, atraumatic, External right and left ear normal.  EYES: Conjunctivae and EOM are normal. Pupils are equal, round, and reactive to light. No scleral icterus.  NECK: Normal range of motion, supple, no masses observed. SKIN: Skin is warm and dry. No rash noted. Not diaphoretic. No erythema. No pallor. MUSCULOSKELETAL: Normal range of motion.  No tenderness.  No cyanosis, clubbing, or edema. NEUROLOGIC: Alert and oriented to person, place, and time. Normal muscle tone coordination.  PSYCHIATRIC: Normal mood and affect. Normal behavior. Normal judgment and thought content. CARDIOVASCULAR: Normal heart rate noted, regular rhythm RESPIRATORY: Clear to auscultation bilaterally. Effort and breath sounds normal, no problems with respiration noted. BREASTS: Symmetric in size. No masses, tenderness, skin changes, nipple drainage, or lymphadenopathy bilaterally. Performed in the presence of a chaperone. ABDOMEN: Soft, no distention noted.  Mild diffuse lower  abdominal tenderness on palpation.  PELVIC: Normal appearing external genitalia and urethral meatus; normal appearing vaginal mucosa and cervix. Brown vaginal discharge noted (patient is at start of period).  Paraguard strings not seen.  Bimanual exam deferred due to patient's discomfort (she will get pelvic ultrasound).  Performed in the presence of a chaperone.  Assessment and Plan:     1. Pelvic pain in female 2. Uterine leiomyoma, unspecified location 3. Intrauterine contraceptive device threads lost, subsequent encounter Ultrasound ordered, will follow up results and manage accordingly. NSAIDs recommended for pain as needed. Advised to try different positions during intercourse, this may help with her discomfort.  Also taking Ibuprofen prior to intercourse could also help. - US  PELVIC COMPLETE WITH TRANSVAGINAL; Future  4. Well woman exam with routine gynecological exam (Primary) Patient is up to date with pap smear, mammogram and colonoscopy. Does not want intervention for perimenopausal symptoms now, told her to let us  know if this changes.  Routine preventative health maintenance measures emphasized. Please refer to After Visit Summary for other counseling recommendations.      GLORIS HUGGER, MD, FACOG Obstetrician & Gynecologist, [facility] for [employer], [facility]

## 2024-05-19 ENCOUNTER — Ambulatory Visit (HOSPITAL_BASED_OUTPATIENT_CLINIC_OR_DEPARTMENT_OTHER)
Admission: RE | Admit: 2024-05-19 | Discharge: 2024-05-19 | Disposition: A | Discharge status: Home / Self Care | Source: Ambulatory Visit | Attending: Obstetrics & Gynecology | Admitting: Obstetrics & Gynecology

## 2024-05-19 DIAGNOSIS — D259 Leiomyoma of uterus, unspecified: Secondary | ICD-10-CM | POA: Diagnosis not present

## 2024-05-19 DIAGNOSIS — N858 Other specified noninflammatory disorders of uterus: Secondary | ICD-10-CM | POA: Diagnosis not present

## 2024-05-19 DIAGNOSIS — N854 Malposition of uterus: Secondary | ICD-10-CM | POA: Diagnosis not present

## 2024-05-19 DIAGNOSIS — R102 Pelvic and perineal pain unspecified side: Secondary | ICD-10-CM

## 2024-05-19 DIAGNOSIS — T8332XD Displacement of intrauterine contraceptive device, subsequent encounter: Secondary | ICD-10-CM | POA: Diagnosis not present

## 2024-05-20 ENCOUNTER — Ambulatory Visit: Payer: Self-pay | Admitting: Family Medicine

## 2024-05-26 ENCOUNTER — Ambulatory Visit: Payer: Self-pay | Admitting: Obstetrics & Gynecology

## 2024-07-31 ENCOUNTER — Encounter: Admitting: Student

## 2024-11-11 ENCOUNTER — Encounter: Admitting: Student
